# Patient Record
Sex: Female | Born: 1980 | Race: Black or African American | Hispanic: No | Marital: Married | Smoking: Former smoker
Health system: Southern US, Community
[De-identification: ages and names within clinical notes are randomized; demographics above are authoritative.]

## PROBLEM LIST (undated history)

## (undated) DIAGNOSIS — R51 Headache: Secondary | ICD-10-CM

## (undated) DIAGNOSIS — D649 Anemia, unspecified: Secondary | ICD-10-CM

## (undated) DIAGNOSIS — R519 Headache, unspecified: Secondary | ICD-10-CM

## (undated) DIAGNOSIS — N189 Chronic kidney disease, unspecified: Secondary | ICD-10-CM

## (undated) DIAGNOSIS — B999 Unspecified infectious disease: Secondary | ICD-10-CM

## (undated) DIAGNOSIS — R87629 Unspecified abnormal cytological findings in specimens from vagina: Secondary | ICD-10-CM

## (undated) HISTORY — PX: CHOLECYSTECTOMY: SHX55

## (undated) HISTORY — PX: CYST REMOVAL HAND: SHX6279

---
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
## 1998-01-13 ENCOUNTER — Encounter: Admission: RE | Admit: 1998-01-13 | Discharge: 1998-01-13 | Payer: Self-pay | Admitting: Family Medicine

## 1998-09-04 ENCOUNTER — Encounter: Payer: Self-pay | Admitting: Emergency Medicine

## 1998-09-04 ENCOUNTER — Emergency Department (HOSPITAL_COMMUNITY): Admission: EM | Admit: 1998-09-04 | Discharge: 1998-09-04 | Payer: Self-pay | Admitting: Emergency Medicine

## 1998-10-28 ENCOUNTER — Emergency Department (HOSPITAL_COMMUNITY): Admission: EM | Admit: 1998-10-28 | Discharge: 1998-10-28 | Payer: Self-pay | Admitting: Emergency Medicine

## 1999-03-04 ENCOUNTER — Ambulatory Visit (HOSPITAL_COMMUNITY): Admission: RE | Admit: 1999-03-04 | Discharge: 1999-03-04 | Payer: Self-pay | Admitting: Obstetrics

## 1999-05-22 ENCOUNTER — Inpatient Hospital Stay (HOSPITAL_COMMUNITY): Admission: AD | Admit: 1999-05-22 | Discharge: 1999-05-26 | Payer: Self-pay | Admitting: Obstetrics & Gynecology

## 1999-07-13 ENCOUNTER — Other Ambulatory Visit: Admission: RE | Admit: 1999-07-13 | Discharge: 1999-07-13 | Payer: Self-pay | Admitting: *Deleted

## 1999-08-20 ENCOUNTER — Inpatient Hospital Stay (HOSPITAL_COMMUNITY): Admission: AD | Admit: 1999-08-20 | Discharge: 1999-08-20 | Payer: Self-pay | Admitting: *Deleted

## 1999-08-20 ENCOUNTER — Inpatient Hospital Stay (HOSPITAL_COMMUNITY): Admission: AD | Admit: 1999-08-20 | Discharge: 1999-08-27 | Payer: Self-pay

## 1999-09-08 ENCOUNTER — Encounter: Admission: RE | Admit: 1999-09-08 | Discharge: 1999-12-07 | Payer: Self-pay | Admitting: Obstetrics & Gynecology

## 2000-12-19 ENCOUNTER — Inpatient Hospital Stay (HOSPITAL_COMMUNITY): Admission: AD | Admit: 2000-12-19 | Discharge: 2000-12-19 | Payer: Self-pay | Admitting: Obstetrics & Gynecology

## 2001-01-30 ENCOUNTER — Inpatient Hospital Stay (HOSPITAL_COMMUNITY): Admission: AD | Admit: 2001-01-30 | Discharge: 2001-01-30 | Payer: Self-pay | Admitting: Obstetrics

## 2001-02-04 ENCOUNTER — Inpatient Hospital Stay (HOSPITAL_COMMUNITY): Admission: AD | Admit: 2001-02-04 | Discharge: 2001-02-04 | Payer: Self-pay | Admitting: Obstetrics

## 2001-02-16 ENCOUNTER — Encounter: Payer: Self-pay | Admitting: Obstetrics

## 2001-02-16 ENCOUNTER — Ambulatory Visit (HOSPITAL_COMMUNITY): Admission: RE | Admit: 2001-02-16 | Discharge: 2001-02-16 | Payer: Self-pay | Admitting: Obstetrics

## 2001-03-03 ENCOUNTER — Encounter (HOSPITAL_COMMUNITY): Admission: RE | Admit: 2001-03-03 | Discharge: 2001-03-16 | Payer: Self-pay | Admitting: Obstetrics

## 2001-03-17 ENCOUNTER — Encounter (INDEPENDENT_AMBULATORY_CARE_PROVIDER_SITE_OTHER): Payer: Self-pay

## 2001-03-17 ENCOUNTER — Inpatient Hospital Stay (HOSPITAL_COMMUNITY): Admission: AD | Admit: 2001-03-17 | Discharge: 2001-03-20 | Payer: Self-pay | Admitting: Obstetrics

## 2006-06-21 DIAGNOSIS — N189 Chronic kidney disease, unspecified: Secondary | ICD-10-CM

## 2006-06-21 HISTORY — DX: Chronic kidney disease, unspecified: N18.9

## 2009-02-21 ENCOUNTER — Emergency Department (HOSPITAL_COMMUNITY): Admission: EM | Admit: 2009-02-21 | Discharge: 2009-02-21 | Payer: Self-pay | Admitting: Emergency Medicine

## 2009-06-13 ENCOUNTER — Emergency Department (HOSPITAL_COMMUNITY): Admission: EM | Admit: 2009-06-13 | Discharge: 2009-06-13 | Payer: Self-pay | Admitting: Emergency Medicine

## 2010-08-17 IMAGING — CR DG CHEST 2V
2 series · 2 of 2 positions shown · non-contrast
Comparison: None.

CLINICAL DATA: Bilateral foot swelling.  Chest tightness.  Smoker.

CHEST - 2 VIEW

[w chest pa]
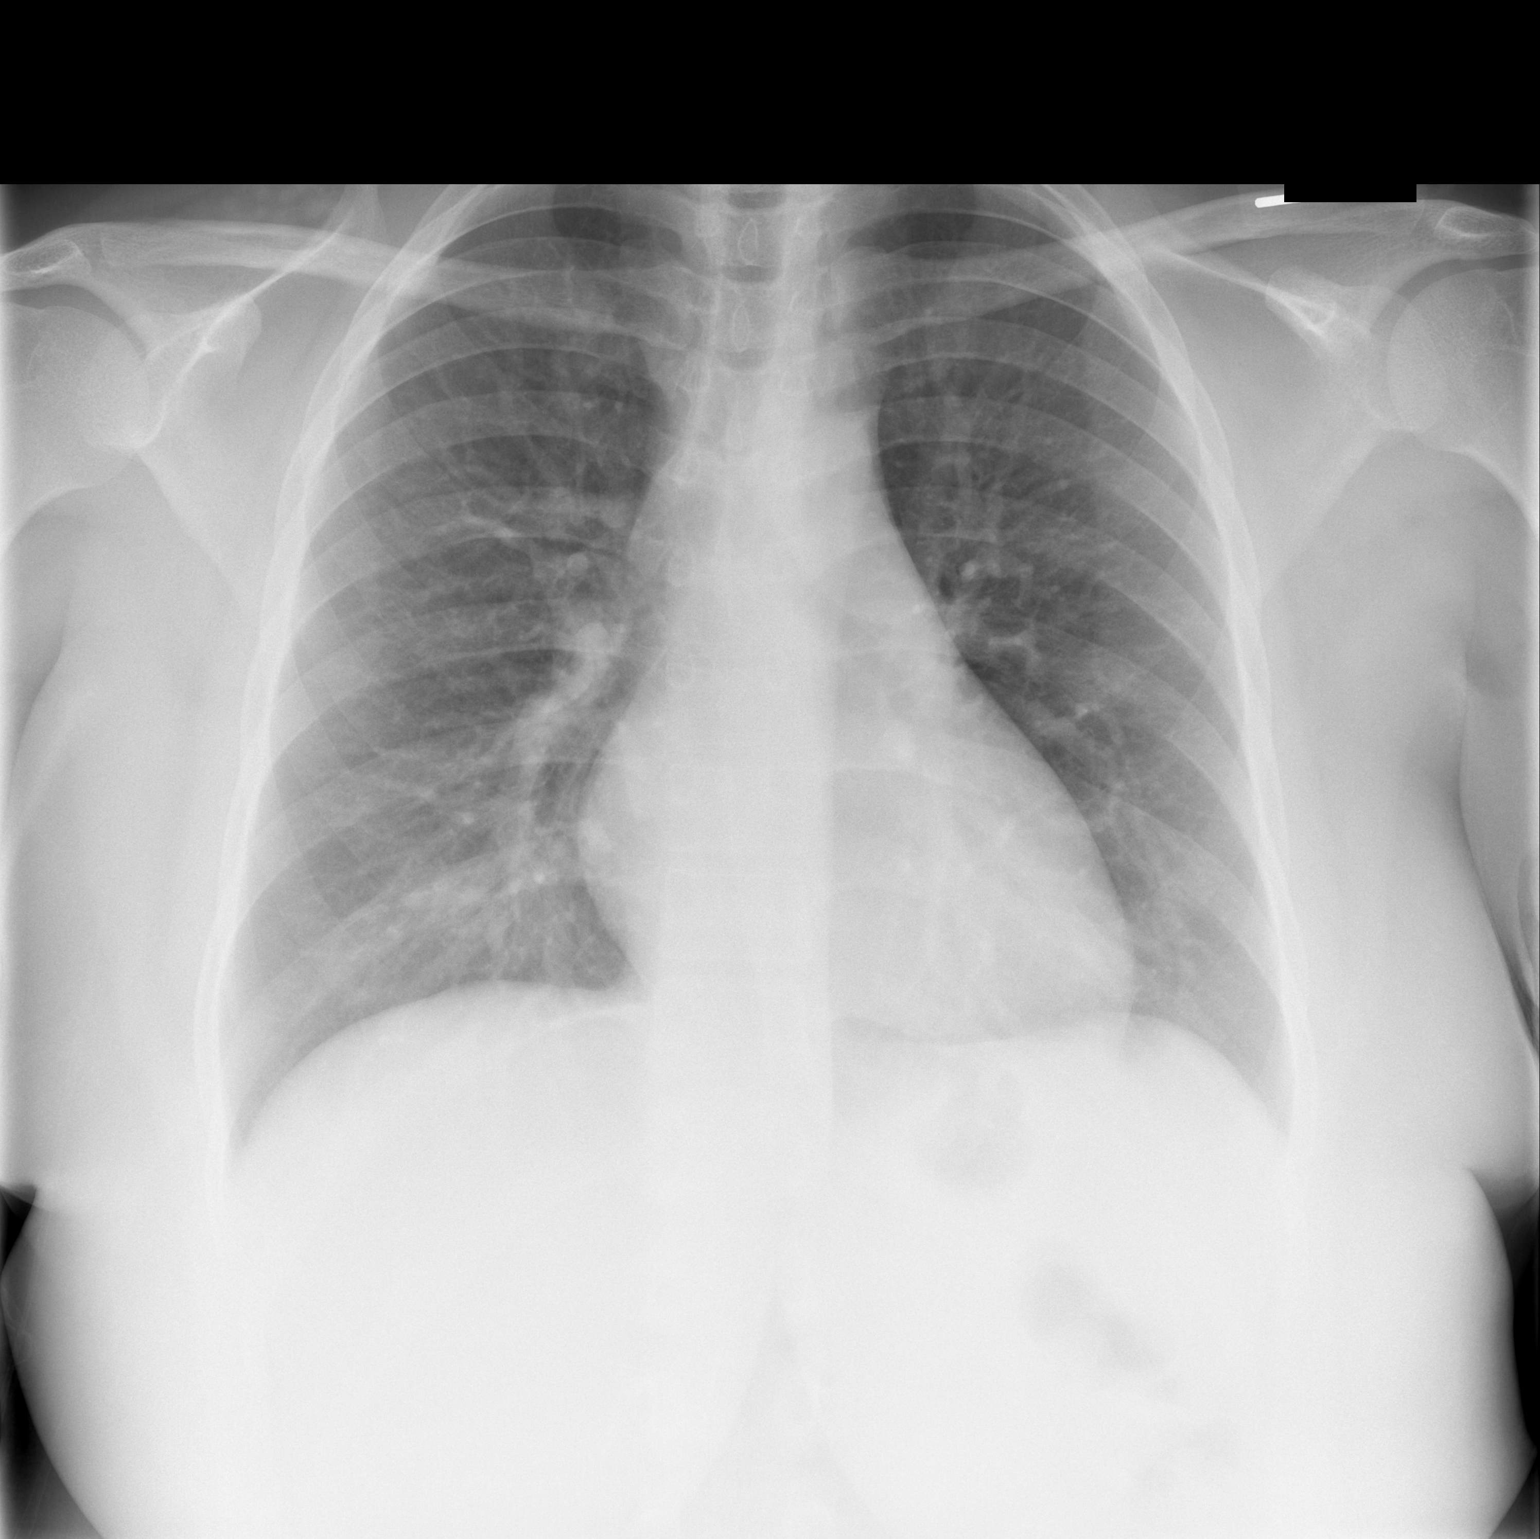

[w chest lat]
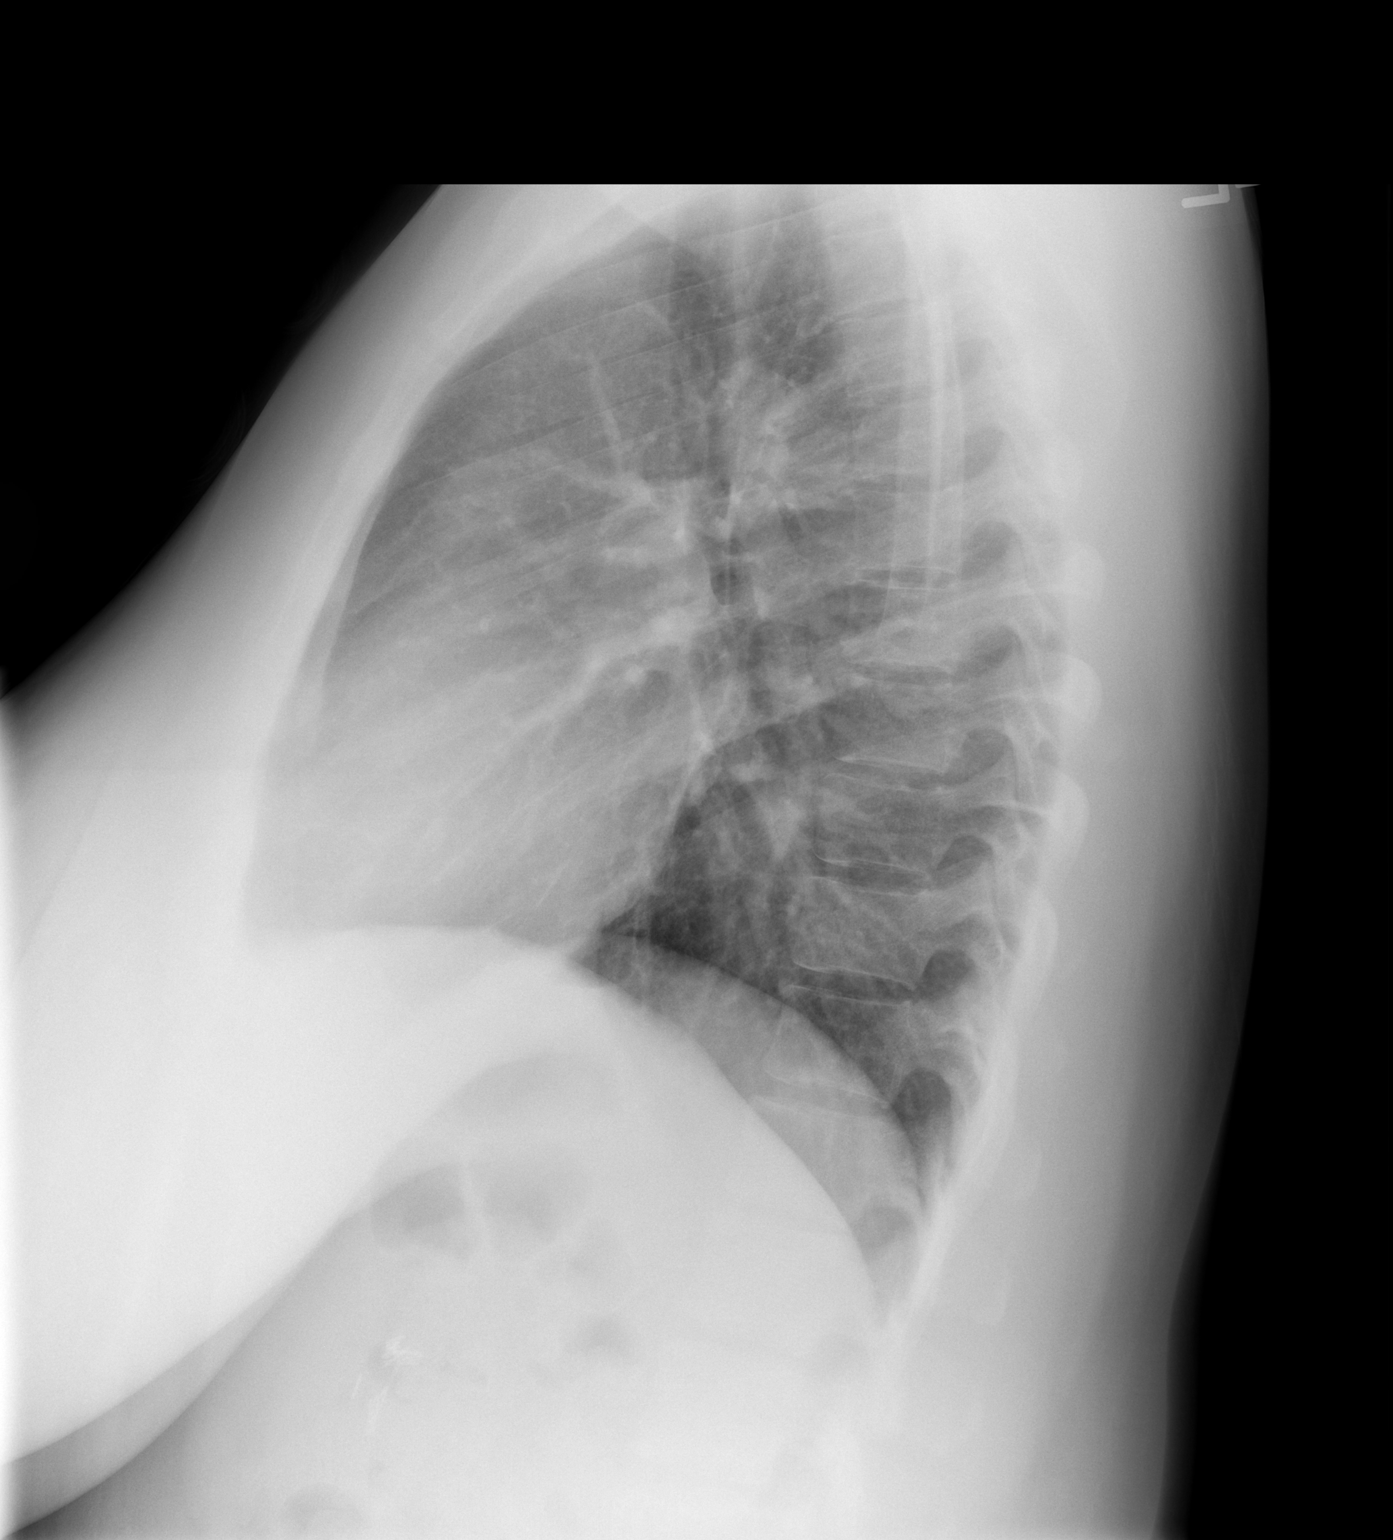

[2 of 2 positions shown; findings below may reference images not displayed]

FINDINGS: Normal sized heart.  Clear lungs.  Cholecystectomy clips.
Unremarkable bones.
IMPRESSION: No acute abnormality.

## 2010-09-21 LAB — URINE MICROSCOPIC-ADD ON

## 2010-09-21 LAB — URINALYSIS, ROUTINE W REFLEX MICROSCOPIC
Bilirubin Urine: NEGATIVE
Glucose, UA: NEGATIVE mg/dL
Ketones, ur: NEGATIVE mg/dL
Nitrite: NEGATIVE
Protein, ur: NEGATIVE mg/dL
Specific Gravity, Urine: 1.035 — ABNORMAL HIGH (ref 1.005–1.030)
Urobilinogen, UA: 1 mg/dL (ref 0.0–1.0)
pH: 6 (ref 5.0–8.0)

## 2010-09-25 LAB — COMPREHENSIVE METABOLIC PANEL
ALT: 12 U/L (ref 0–35)
AST: 20 U/L (ref 0–37)
Alkaline Phosphatase: 59 U/L (ref 39–117)
CO2: 26 mEq/L (ref 19–32)
Calcium: 8.7 mg/dL (ref 8.4–10.5)
GFR calc Af Amer: >60 mL/min (ref >60)
GFR calc non Af Amer: >60 mL/min (ref >60)
Potassium: 3.9 mEq/L (ref 3.5–5.1)
Sodium: 139 mEq/L (ref 135–145)

## 2010-09-25 LAB — URINALYSIS, ROUTINE W REFLEX MICROSCOPIC
Bilirubin Urine: NEGATIVE
Glucose, UA: NEGATIVE mg/dL
Hgb urine dipstick: NEGATIVE
Specific Gravity, Urine: 1.025 (ref 1.005–1.030)
Urobilinogen, UA: 0.2 mg/dL (ref 0.0–1.0)
pH: 8 (ref 5.0–8.0)

## 2010-11-06 NOTE — Discharge Summary (Signed)
[facility]  Patient:    Sarah Barr, Sarah Barr Visit Number: [id number] MRN: [id number]          Service Type: Attending:  Kathreen Cosier, M.D. Dictated by:   Kathreen Cosier, M.D. Adm. Date:  03/17/01 Disc. Date: 03/20/01                             Discharge Summary  HISTORY:                      The patient is a 30 year old gravida 2, para 1-0-0-1 with an EDC of March 19, 2001.  She had a previous C-section because of nonreassuring fetal heart rate tracing.  The patient was admitted on the morning of September 27 in labor and desired repeat C-section.  This was performed.  She had a female, Apgars of 8 and 9 weighing 9 lb 1 oz.  The placenta was sent to pathology.  Fluid was meconium stained.  On admission, her hemoglobin was 9.8, platelets 393.  On discharge, her hemoglobin was 8.2. She did well and was discharged on the third postoperative day, ambulatory and on a regular diet on Darvocet-N 100, one q.3-4h. p.r.n. pain and ferrous sulfate 325, one p.o. q.d.  DISCHARGE DIAGNOSIS:          Status post repeat low transverse cesarean section at term in labor. Dictated by:   Kathreen Cosier, M.D. Attending:  Kathreen Cosier, M.D. DD:  03/20/01 TD:  03/20/01 Job: [id number]

## 2010-11-06 NOTE — Op Note (Signed)
[facility]  Patient:    Sarah Barr, Sarah Barr                       MRN: [id number] Proc. Date: 08/21/99 Adm. Date:  [id number] Disc. Date: [id number] Attending:  Antionette Char                           Operative Report  PREOPERATIVE DIAGNOSIS:       Intrauterine pregnancy at term.  Latent labor with meconium stained fluid.  Nonreassuring fetal heart rate tracing with late decellerations.  POSTOPERATIVE DIAGNOSIS:      Intrauterine pregnancy at term.  Latent labor with meconium stained fluid.  Nonreassuring fetal heart rate tracing with late decellerations.  OPERATION:                    Primary low transverse cesarean section via Pfannenstiel incision.  SURGEON:                      Roseanna Rainbow, M.D.  ASSISTANT:                    Bing Neighbors. Clearance Coots, M.D.  ANESTHESIA:                   Epidural.  COMPLICATIONS:                None.  ESTIMATED BLOOD LOSS:         800 cc.  FLUIDS:                       1500 cc of Ringers lactate.  URINE OUTPUT:                 300 cc of clear urine at the end of the procedure.  INDICATIONS:                  The patient was a para 0 at term who underwent artificial rupture of membranes in latent labor for thick meconium stained fluid and then proceeded to have late decellerations.  Maximum dilatation was 3 to 4 m.  FINDINGS:                     Female infant in cephalic presentation.  The meconium was thin, as it had been diluted by the amnioinfusion.  Pediatrics were present at delivery.  Normal uterus, tubes, and ovaries.  DESCRIPTION OF PROCEDURE:     The patient was taken to the operating room where  epidural anesthetic was found to be adequate.  She was prepped and draped in the usual sterile fashion in the dorsal supine position with a leftward tilt.  A Pfannenstiel skin incision was then made with a scalpel and carried through to he underlying layer of fascia.  The fascia was incised  in the midline and the incision extended laterally with Mayo scissors.  The superior aspect of the fascial incision was then grasped with the Kocher clamps, elevated, and the underlying rectus muscles dissected off sharply.  Attention was then turned to the inferior aspect of this incision which was manipulated in a similar fashion.  The rectus muscles were separated in the midline.  The parietoperitoneum was entered sharply.  The peritoneal incision was extended bluntly.  The bladder blade was inserted and the vesicouterine peritoneum was identified, grasped with pickups, and entered sharply  with Metzenbaum scissors.  The incision was then extended laterally and the bladder flap created digitally.  The bladder blade was then reinserted and the lower uterine segment incised in a transverse fashion with the scalpel.  The uterine incision was then extended laterally with bandage scissors.  The bladder blade as removed and the infants head delivered atraumatically.  The nose and mouth were  suctioned with a DeLee suction trap and the cord was clamped and cut.  The infant was handed off to the awaiting pediatricians.  Cord gases were sent.  The placenta was then removed.  The uterus exteriorized and cleared of all clots and debris.  Following inspection of the uterine incision, there was a small right sided inferolateral extension of the uterine incision, approximately 2 cm.  This was repaired separately with 0 Monocryl.  The remainder of the uterine incision was  repaired with 0 Monocryl in a running locking fashion.  Excellent hemostasis was noted.  The gutters were cleared of all clots and after the uterus was returned to the abdomen, the fascia was reapproximated with 0 Vicryl in a running fashion. The skin was closed with staples.  The patient tolerated the procedure well. Sponge, needle, and instrument counts were correct x 2.  2 grams of Cefazolin were given at cord  clamp.  The patient was taken to the PACU in stable condition. DD:  08/24/99 TD:  08/25/99 Job: [id number]

## 2010-11-06 NOTE — Op Note (Signed)
[facility]  Patient:    Sarah Barr, Sarah Barr Visit Number: [id number] MRN: [id number]          Service Type: OBS Location: 910A [id number] [id number] Attending Physician:  Venita Sheffield Dictated by:   Kathreen Cosier, M.D. Proc. Date: 03/17/01 Admit Date:  03/17/2001                             Operative Report  PREOPERATIVE DIAGNOSIS:       Previous cesarean section at term, in labor, and                               desires repeat.  POSTOPERATIVE DIAGNOSIS:      Previous cesarean section at term, in labor, and                               desires repeat.  OPERATION:  SURGEON:                      Kathreen Cosier, M.D.  ANESTHESIA:                   Spinal.  DESCRIPTION OF PROCEDURE:     The patient was placed on the operating table in the supine position.  The abdomen was prepped and draped.  The bladder was emptied with a Foley catheter.  A transverse suprapubic incision was made through the old scar and carried down to the rectus fascia.  The fascia was ________ the length of the incision.  The recti muscles were retracted laterally.  The peritoneal incision and a transverse made in the visceroperitoneum above the bladder, and the bladder was pushed down off the lower segment.  A transverse lower uterine incision made  The fluid was meconium stained.  The baby was DeLeed prior to the shoulders.  She delivered a female with Apgars of 8 and 9.  Weight 9 pound and 1 ounce from the OP position.  The team was in attendance.  The placenta was removed manually.  It was anterior.  The placenta was sent to pathology.  The uterine cavity was cleaned with dry laps.  The uterine incision closed in one layer with continuous suture of #1 chromic.  Hemostasis was satisfactory.  The bladder flap was reattached with 2-0 chromic.  The uterus well-contracted.  The tubes and ovaries normal.  The abdomen was closed in layers.  The peritoneum  with continuous suture of 0 chromic, the fascia with continuous suture of 0 Dexon, and the skin closed with subcuticular stitch of 3-0 Monocryl.  Blood loss 400 cc.  The patient tolerated the procedure well and was taken to the recovery room in good condition. Dictated by:   Kathreen Cosier, M.D. Attending Physician:  Venita Sheffield DD:  03/17/01 TD:  03/17/01 Job: [id number]

## 2010-11-06 NOTE — Discharge Summary (Signed)
[facility]  Patient:    Sarah Barr, Sarah Barr                       MRN: [id number] Adm. Date:  [id number] Disc. Date: [id number] Attending:  Antionette Char Dictator:   Nolon Nations, M.D.                           Discharge Summary  ADMISSION DIAGNOSES:          1. Nonreassuring fetal heart rate strip.                               2. Term pregnancy.  DISCHARGE DIAGNOSES:          1. Status post low transverse cesarean section.                               2. Endometritis.  PROCEDURE:                    Primary low transverse cesarean section on March , 2001.  HISTORY OF PRESENT ILLNESS:   Ms. Sarah Barr is a 29 year old, gravida 1, para 0, ho presented at 40-5/7 weeks with increasing contractions.  She was noting no bleeding or rupture of membranes and had noted fetal movement.  She had been seen earlier in the MAU for contractions.  Ms. Sarah Barr had a history only significant for mild dysplasia on a previous Pap.  She was noted to have a nonreassuring fetal heart  rate strip with variable decellerations and was admitted to L&D for fetal monitoring and consideration of Pitocin.  HOSPITAL COURSE:              The patient was admitted and noted to have continued late decellerations on the fetal heart rate monitor overnight.  The patients membranes were ruptured and amnioinfusion was started.  The patient was noted to have meconium stained fluid and repetitive variables and fairly large fetus. the patient failed to progress and was taken to the OR for a primary LTCS.  The infant was taken to the NICU for meconium.  Her postpartum course was complicated by endometritis.  She was given ampicillin, gentamicin, and clindamycin and was discharged after being afebrile for 48 hours.  CONDITION ON DISCHARGE:       Good.  Discharged to home.  DISCHARGE MEDICATIONS:        1. Augmentin 875 mg b.i.d. x 5 days.                               2. Motrin  600 mg q.6h. p.r.n. pain.                               3. Vicodin one tablet q.6h. p.r.n. severe pain.                               4. Iron 375 mg b.i.d.                               5. Colace over-the-counter while on iron. DD:  11/04/99  TD:  11/05/99 Job: [id number]

## 2011-01-26 ENCOUNTER — Emergency Department (HOSPITAL_COMMUNITY)
Admission: EM | Admit: 2011-01-26 | Discharge: 2011-01-26 | Disposition: A | Discharge status: Home / Self Care | Payer: Self-pay | Attending: Emergency Medicine | Admitting: Emergency Medicine

## 2011-01-26 DIAGNOSIS — K029 Dental caries, unspecified: Secondary | ICD-10-CM | POA: Insufficient documentation

## 2011-01-26 DIAGNOSIS — K089 Disorder of teeth and supporting structures, unspecified: Secondary | ICD-10-CM | POA: Insufficient documentation

## 2011-01-26 DIAGNOSIS — E669 Obesity, unspecified: Secondary | ICD-10-CM | POA: Insufficient documentation

## 2011-03-26 ENCOUNTER — Emergency Department (HOSPITAL_COMMUNITY): Payer: Medicaid Other

## 2011-03-26 ENCOUNTER — Emergency Department (HOSPITAL_COMMUNITY)
Admission: EM | Admit: 2011-03-26 | Discharge: 2011-03-26 | Disposition: A | Discharge status: Home / Self Care | Payer: Medicaid Other | Attending: Emergency Medicine | Admitting: Emergency Medicine

## 2011-03-26 DIAGNOSIS — M5430 Sciatica, unspecified side: Secondary | ICD-10-CM | POA: Insufficient documentation

## 2011-03-26 DIAGNOSIS — M545 Low back pain, unspecified: Secondary | ICD-10-CM | POA: Insufficient documentation

## 2011-12-29 ENCOUNTER — Emergency Department (HOSPITAL_COMMUNITY)
Admission: EM | Admit: 2011-12-29 | Discharge: 2011-12-29 | Disposition: A | Discharge status: Home / Self Care | Payer: Medicaid Other | Attending: Emergency Medicine | Admitting: Emergency Medicine

## 2011-12-29 ENCOUNTER — Encounter (HOSPITAL_COMMUNITY): Payer: Self-pay | Admitting: *Deleted

## 2011-12-29 DIAGNOSIS — J069 Acute upper respiratory infection, unspecified: Secondary | ICD-10-CM | POA: Insufficient documentation

## 2011-12-29 DIAGNOSIS — F17200 Nicotine dependence, unspecified, uncomplicated: Secondary | ICD-10-CM | POA: Insufficient documentation

## 2011-12-29 MED ORDER — AZITHROMYCIN 250 MG PO TABS: 250.0000 mg | ORAL_TABLET | Freq: Every day | ORAL | Status: AC

## 2011-12-29 MED ORDER — HYDROCOD POLST-CHLORPHEN POLST 10-8 MG/5ML PO LQCR: 5.0000 mL | Freq: Two times a day (BID) | ORAL | Status: DC | PRN

## 2011-12-29 NOTE — ED Notes (Addendum)
Last night developed cold upper resp symptoms, cough that results in vomiting, nasal congestion, headache worse with cough

## 2011-12-29 NOTE — ED Notes (Signed)
Pt states she has had dry cough since last night. Pt states she is having congestion and no sob. Pt states she started to have a productive cough this morning sputum noted to be yellow and thick

## 2011-12-29 NOTE — ED Provider Notes (Signed)
History     CSN: [id number]  Arrival date & time 12/29/11  1517   First MD Initiated Contact with Patient 12/29/11 1707      Chief Complaint  Patient presents with  . URI    (Consider location/radiation/quality/duration/timing/severity/associated sxs/prior treatment) HPI  Generally healthy 31 year old female presents with cold symptoms. Patient's report since last night she has had headache, runny nose, sneezing, cough productive with yellow sputum, chest congestions, body aches, and feeling tired. She tried drinking hot tea and cough syrup without improvement. She was unable to sleep due to the symptoms. She went to work today but states her sxs worsen.  She denies fever, hemoptysis, nausea, vomiting, abdominal pain, back pain, urinary symptoms. Denies any recent sick contact.  History reviewed. No pertinent past medical history.  History reviewed. No pertinent past surgical history.  No family history on file.  History  Substance Use Topics  . Smoking status: Current Some Day Smoker  . Smokeless tobacco: Not on file  . Alcohol Use: No    OB History    Grav Para Term Preterm Abortions TAB SAB Ect Mult Living                  Review of Systems  All other systems reviewed and are negative.    Allergies  Review of patient's allergies indicates no known allergies.  Home Medications   Current Outpatient Rx  Name Route Sig Dispense Refill  . IBUPROFEN 200 MG PO TABS Oral Take 200 mg by mouth every 6 (six) hours as needed. Pain      BP 120/85  Pulse 82  Temp 98.7 F (37.1 C) (Oral)  Resp 18  SpO2 100%  LMP 12/21/2011  Physical Exam  Nursing note and vitals reviewed. Constitutional: She appears well-developed and well-nourished. No distress.       Awake, alert, nontoxic appearance  HENT:  Head: Atraumatic.  Right Ear: Hearing, tympanic membrane and external ear normal.  Left Ear: Hearing, tympanic membrane, external ear and ear canal normal.  Nose:  Mucosal edema and rhinorrhea present. No nasal deformity, septal deviation or nasal septal hematoma.  Mouth/Throat: Uvula is midline, oropharynx is clear and moist and mucous membranes are normal.  Eyes: Conjunctivae are normal. Right eye exhibits no discharge. Left eye exhibits no discharge.  Neck: Neck supple.  Cardiovascular: Normal rate and regular rhythm.   Pulmonary/Chest: Effort normal. No respiratory distress. She exhibits no tenderness.       Mild scattered rhonchi noted to the upper lung field  Abdominal: Soft. There is no tenderness. There is no rebound.  Musculoskeletal: She exhibits no tenderness.       ROM appears intact, no obvious focal weakness  Neurological:       Mental status and motor strength appears intact  Skin: No rash noted.  Psychiatric: She has a normal mood and affect.    ED Course  Procedures (including critical care time)  Labs Reviewed - No data to display No results found.   No diagnosis found.  1. URI  MDM  Patient with URI symptoms. However she is a smoker, therefore prescribe Z-Pak to prevent secondary infection.  Pt voice understanding and agrees with plan.  Recommend hand washing and wear a face mask at work to decrease risk of transmitting.          [name], PA-C 12/29/11 1717

## 2011-12-29 NOTE — ED Provider Notes (Signed)
Medical screening examination/treatment/procedure(s) were performed by non-physician practitioner and as supervising physician I was immediately available for consultation/collaboration.  Devoria Albe, MD, FACEP   Ward Givens, MD 12/29/11 [phone]

## 2012-05-25 ENCOUNTER — Emergency Department (HOSPITAL_COMMUNITY)
Admission: EM | Admit: 2012-05-25 | Discharge: 2012-05-26 | Disposition: A | Discharge status: Home / Self Care | Payer: Medicaid Other | Attending: Emergency Medicine | Admitting: Emergency Medicine

## 2012-05-25 ENCOUNTER — Encounter (HOSPITAL_COMMUNITY): Payer: Self-pay | Admitting: *Deleted

## 2012-05-25 DIAGNOSIS — F17200 Nicotine dependence, unspecified, uncomplicated: Secondary | ICD-10-CM | POA: Insufficient documentation

## 2012-05-25 DIAGNOSIS — R109 Unspecified abdominal pain: Secondary | ICD-10-CM | POA: Insufficient documentation

## 2012-05-25 LAB — URINALYSIS, ROUTINE W REFLEX MICROSCOPIC
Ketones, ur: NEGATIVE mg/dL
Nitrite: NEGATIVE
Specific Gravity, Urine: 1.037 — ABNORMAL HIGH (ref 1.005–1.030)
pH: 6 (ref 5.0–8.0)

## 2012-05-25 LAB — CBC WITH DIFFERENTIAL/PLATELET
Basophils Absolute: 0 10*3/uL (ref 0.0–0.1)
Eosinophils Absolute: 0.3 10*3/uL (ref 0.0–0.7)
HCT: 33 % — ABNORMAL LOW (ref 36.0–46.0)
Lymphocytes Relative: 31 % (ref 12–46)
Lymphs Abs: 2.6 10*3/uL (ref 0.7–4.0)
MCHC: 31.8 g/dL (ref 30.0–36.0)
MCV: 68.6 fL — ABNORMAL LOW (ref 78.0–100.0)
Neutro Abs: 4.8 10*3/uL (ref 1.7–7.7)
Platelets: 410 10*3/uL — ABNORMAL HIGH (ref 150–400)
RDW: 18.5 % — ABNORMAL HIGH (ref 11.5–15.5)

## 2012-05-25 LAB — COMPREHENSIVE METABOLIC PANEL
Albumin: 3.3 g/dL — ABNORMAL LOW (ref 3.5–5.2)
BUN: 11 mg/dL (ref 6–23)
Calcium: 9.1 mg/dL (ref 8.4–10.5)
Creatinine, Ser: 0.83 mg/dL (ref 0.50–1.10)
Total Protein: 7.5 g/dL (ref 6.0–8.3)

## 2012-05-25 LAB — URINE MICROSCOPIC-ADD ON

## 2012-05-25 LAB — LIPASE, BLOOD: Lipase: 29 U/L (ref 11–59)

## 2012-05-25 MED ORDER — KETOROLAC TROMETHAMINE 60 MG/2ML IM SOLN
60.0000 mg | Freq: Once | INTRAMUSCULAR | Status: AC
  Administered 2012-05-26: 60 mg via INTRAMUSCULAR
  Filled 2012-05-25: qty 2

## 2012-05-25 NOTE — ED Notes (Signed)
Pt c/o lower abd pain and lower back pain. States "my whole family has fibroids and I think that's what it is." pt denies vaginal bleeding at this time.

## 2012-05-25 NOTE — ED Provider Notes (Addendum)
History     CSN: [id number]  Arrival date & time 05/25/12  1714   First MD Initiated Contact with Patient 05/25/12 2348      Chief Complaint  Patient presents with  . Abdominal Pain    (Consider location/radiation/quality/duration/timing/severity/associated sxs/prior treatment) HPI Comments: Pt is a 31 y/o female with no chronic med problems on no meds.  Has had pain in the lower abdomen off and on X several months - worsened last night, Pain is in the R and L lower abd and pelvis.  Feels like Achy and crampy and sometimes the pain shoots into the vaginal area.  Went off cycle 2 weeks ago - has had increased Vaginal d/c.  Admits to having new constipation, last stool was while in waiting room but was hard.  Has mild nausea but no vomiting.  No f/c/cough/sob.  No swellign in legs.  Sx are intemrittent, nothing makes better.  Worse with using the bathroom.  No pain with eating.    Has had chole and Cesarean X 3 no GYN at this time.  Patient is a 31 y.o. female presenting with abdominal pain. The history is provided by the patient.  Abdominal Pain The primary symptoms of the illness include abdominal pain.    History reviewed. No pertinent past medical history.  Past Surgical History  Procedure Date  . Cholecystectomy   . Cesarean section     x's 3.   . Cyst removal hand     History reviewed. No pertinent family history.  History  Substance Use Topics  . Smoking status: Current Some Day Smoker    Types: Cigarettes  . Smokeless tobacco: Not on file  . Alcohol Use: No    OB History    Grav Para Term Preterm Abortions TAB SAB Ect Mult Living                  Review of Systems  Gastrointestinal: Positive for abdominal pain.  All other systems reviewed and are negative.    Allergies  Review of patient's allergies indicates no known allergies.  Home Medications   Current Outpatient Rx  Name  Route  Sig  Dispense  Refill  . IBUPROFEN 200 MG PO TABS   Oral   Take  200 mg by mouth every 6 (six) hours as needed. Pain         . NAPROXEN 500 MG PO TABS   Oral   Take 1 tablet (500 mg total) by mouth 2 (two) times daily with a meal.   30 tablet   0     BP 134/71  Pulse 95  Temp 98.8 F (37.1 C) (Oral)  Resp 20  Ht 5\' 8"  (1.727 m)  Wt 265 lb (120.203 kg)  BMI 40.29 kg/m2  SpO2 100%  LMP 05/07/2012  Physical Exam  Nursing note and vitals reviewed. Constitutional: She appears well-developed and well-nourished. No distress.  HENT:  Head: Normocephalic and atraumatic.  Mouth/Throat: Oropharynx is clear and moist. No oropharyngeal exudate.  Eyes: Conjunctivae normal and EOM are normal. Pupils are equal, round, and reactive to light. Right eye exhibits no discharge. Left eye exhibits no discharge. No scleral icterus.  Neck: Normal range of motion. Neck supple. No JVD present. No thyromegaly present.  Cardiovascular: Normal rate, regular rhythm, normal heart sounds and intact distal pulses.  Exam reveals no gallop and no friction rub.   No murmur heard. Pulmonary/Chest: Effort normal and breath sounds normal. No respiratory distress. She has no wheezes.  She has no rales.  Abdominal: Soft. Bowel sounds are normal. She exhibits no distension and no mass. There is tenderness ( SP ttp without guarding or peritoneal signs, no other ttp, obese.  no cCVA ttp).  Genitourinary:       Normal appearing external genitalia, no cervical motion tenderness or adnexal tenderness or masses, no significant discharge, no bleeding   Chaperone present for entire exam  Musculoskeletal: Normal range of motion. She exhibits no edema and no tenderness.  Lymphadenopathy:    She has no cervical adenopathy.  Neurological: She is alert. Coordination normal.  Skin: Skin is warm and dry. No rash noted. No erythema.  Psychiatric: She has a normal mood and affect. Her behavior is normal.    ED Course  Procedures (including critical care time)  Labs Reviewed  CBC WITH  DIFFERENTIAL - Abnormal; Notable for the following:    Hemoglobin 10.5 (*)     HCT 33.0 (*)     MCV 68.6 (*)     MCH 21.8 (*)     RDW 18.5 (*)     Platelets 410 (*)     All other components within normal limits  COMPREHENSIVE METABOLIC PANEL - Abnormal; Notable for the following:    Albumin 3.3 (*)     Total Bilirubin 0.2 (*)     All other components within normal limits  URINALYSIS, ROUTINE W REFLEX MICROSCOPIC - Abnormal; Notable for the following:    APPearance CLOUDY (*)     Specific Gravity, Urine 1.037 (*)     Leukocytes, UA SMALL (*)     All other components within normal limits  URINE MICROSCOPIC-ADD ON - Abnormal; Notable for the following:    Squamous Epithelial / LPF MANY (*)     Bacteria, UA MANY (*)     All other components within normal limits  WET PREP, GENITAL - Abnormal; Notable for the following:    WBC, Wet Prep HPF POC RARE (*)     All other components within normal limits  LIPASE, BLOOD  POCT PREGNANCY, URINE  URINE CULTURE  GC/CHLAMYDIA PROBE AMP   No results found.   1. Abdominal pain       MDM  The patient has lower pelvic pain, she does not have a urinary infection, her lab work shows mild anemia which I have discussed with the patient, she will followup with family doctor or GYN this week. She may need an ultrasound for outpatient further evaluation of either ovarian cyst or uterine fibroids. She has not had any heart signs of STDs, she is not have any findings on her wet prep of significant infections, she can followup as an outpatient, Naprosyn prescribed, doubt ovarian torsion or appendicitis given the location of pain and timing.        Vida Roller, MD 05/26/12 7829  Vida Roller, MD 05/26/12 [phone]

## 2012-05-26 LAB — WET PREP, GENITAL: Trich, Wet Prep: NONE SEEN

## 2012-05-26 LAB — URINE CULTURE

## 2012-05-26 LAB — GC/CHLAMYDIA PROBE AMP: CT Probe RNA: NEGATIVE

## 2012-05-26 MED ORDER — NAPROXEN 500 MG PO TABS: 500.0000 mg | ORAL_TABLET | Freq: Two times a day (BID) | ORAL | Status: DC

## 2012-05-26 NOTE — ED Notes (Signed)
Pelvic cart set up at pt bedside. 

## 2012-05-31 ENCOUNTER — Encounter: Payer: Medicaid Other | Admitting: Obstetrics & Gynecology

## 2012-08-04 ENCOUNTER — Encounter (HOSPITAL_COMMUNITY): Payer: Self-pay | Admitting: Emergency Medicine

## 2012-08-04 ENCOUNTER — Emergency Department (HOSPITAL_COMMUNITY)
Admission: EM | Admit: 2012-08-04 | Discharge: 2012-08-04 | Disposition: A | Discharge status: Home / Self Care | Payer: No Typology Code available for payment source | Attending: Emergency Medicine | Admitting: Emergency Medicine

## 2012-08-04 DIAGNOSIS — IMO0001 Reserved for inherently not codable concepts without codable children: Secondary | ICD-10-CM | POA: Insufficient documentation

## 2012-08-04 DIAGNOSIS — Y92410 Unspecified street and highway as the place of occurrence of the external cause: Secondary | ICD-10-CM | POA: Insufficient documentation

## 2012-08-04 DIAGNOSIS — Z9089 Acquired absence of other organs: Secondary | ICD-10-CM | POA: Insufficient documentation

## 2012-08-04 DIAGNOSIS — M549 Dorsalgia, unspecified: Secondary | ICD-10-CM

## 2012-08-04 DIAGNOSIS — Y9389 Activity, other specified: Secondary | ICD-10-CM | POA: Insufficient documentation

## 2012-08-04 DIAGNOSIS — F17200 Nicotine dependence, unspecified, uncomplicated: Secondary | ICD-10-CM | POA: Insufficient documentation

## 2012-08-04 DIAGNOSIS — IMO0002 Reserved for concepts with insufficient information to code with codable children: Secondary | ICD-10-CM | POA: Insufficient documentation

## 2012-08-04 MED ORDER — OXYCODONE-ACETAMINOPHEN 5-325 MG PO TABS: ORAL_TABLET | ORAL | Status: DC

## 2012-08-04 MED ORDER — CYCLOBENZAPRINE HCL 10 MG PO TABS: 10.0000 mg | ORAL_TABLET | Freq: Two times a day (BID) | ORAL | Status: DC | PRN
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
## 2012-08-05 NOTE — ED Provider Notes (Signed)
Medical screening examination/treatment/procedure(s) were performed by non-physician practitioner and as supervising physician I was immediately available for consultation/collaboration.   Dione Booze, MD 08/05/12 [phone]

## 2012-09-15 ENCOUNTER — Encounter (HOSPITAL_COMMUNITY): Payer: Self-pay

## 2012-09-15 ENCOUNTER — Emergency Department (HOSPITAL_COMMUNITY)
Admission: EM | Admit: 2012-09-15 | Discharge: 2012-09-15 | Disposition: A | Discharge status: Home / Self Care | Payer: Medicaid Other | Attending: Emergency Medicine | Admitting: Emergency Medicine

## 2012-09-15 DIAGNOSIS — F17200 Nicotine dependence, unspecified, uncomplicated: Secondary | ICD-10-CM | POA: Insufficient documentation

## 2012-09-15 DIAGNOSIS — R059 Cough, unspecified: Secondary | ICD-10-CM | POA: Insufficient documentation

## 2012-09-15 DIAGNOSIS — J3489 Other specified disorders of nose and nasal sinuses: Secondary | ICD-10-CM | POA: Insufficient documentation

## 2012-09-15 DIAGNOSIS — J040 Acute laryngitis: Secondary | ICD-10-CM

## 2012-09-15 DIAGNOSIS — R05 Cough: Secondary | ICD-10-CM | POA: Insufficient documentation

## 2012-09-15 DIAGNOSIS — B9789 Other viral agents as the cause of diseases classified elsewhere: Secondary | ICD-10-CM | POA: Insufficient documentation

## 2012-09-15 DIAGNOSIS — R491 Aphonia: Secondary | ICD-10-CM | POA: Insufficient documentation

## 2012-09-15 DIAGNOSIS — L539 Erythematous condition, unspecified: Secondary | ICD-10-CM | POA: Insufficient documentation

## 2012-09-15 DIAGNOSIS — R6883 Chills (without fever): Secondary | ICD-10-CM | POA: Insufficient documentation

## 2012-09-15 DIAGNOSIS — R51 Headache: Secondary | ICD-10-CM | POA: Insufficient documentation

## 2012-09-15 DIAGNOSIS — B349 Viral infection, unspecified: Secondary | ICD-10-CM

## 2012-09-15 MED ORDER — HYDROCODONE-ACETAMINOPHEN 7.5-325 MG/15ML PO SOLN: 15.0000 mL | Freq: Four times a day (QID) | ORAL | Status: DC | PRN

## 2012-09-15 MED ORDER — PREDNISOLONE SODIUM PHOSPHATE 15 MG/5ML PO SOLN: 15.0000 mg | Freq: Every day | ORAL | Status: AC

## 2012-09-15 NOTE — ED Provider Notes (Signed)
Medical screening examination/treatment/procedure(s) were performed by non-physician practitioner and as supervising physician I was immediately available for consultation/collaboration.   Charles B. Sheldon, MD 09/15/12 1520 

## 2012-09-15 NOTE — ED Provider Notes (Signed)
History     CSN: [id number]  Arrival date & time 09/15/12  1258   First MD Initiated Contact with Patient 09/15/12 1331      No chief complaint on file.   (Consider location/radiation/quality/duration/timing/severity/associated sxs/prior treatment) HPI  32 year old female presents complaining of sore throat. Patient reports for the past 2 days she has had headache, nasal congestion, sore throat, cough productive of yellow phlegm, and chills. She also lost her voice this morning.  Her daughter is getting over a cold.  Pt has tried nothing for her sxs.  Nothing makes it better or worse.  Denies fever, vision changes, ear pain, hemoptysis, cp, sob, or rash.  Is an occasional smoker.    History reviewed. No pertinent past medical history.  Past Surgical History  Procedure Laterality Date  . Cholecystectomy    . Cesarean section      x's 3.   . Cyst removal hand      History reviewed. No pertinent family history.  History  Substance Use Topics  . Smoking status: Current Some Day Smoker    Types: Cigarettes  . Smokeless tobacco: Not on file  . Alcohol Use: No    OB History   Grav Para Term Preterm Abortions TAB SAB Ect Mult Living                  Review of Systems  Constitutional:       10 Systems reviewed and all are negative for acute change except as noted in the HPI.     Allergies  Review of patient's allergies indicates no known allergies.  Home Medications   Current Outpatient Rx  Name  Route  Sig  Dispense  Refill  . Ibuprofen (IBU PO)   Oral   Take 1 tablet by mouth once.           BP 163/80  Pulse 88  Temp(Src) 99 F (37.2 C) (Oral)  Resp 20  SpO2 98%  LMP 09/15/2012  Physical Exam  Nursing note and vitals reviewed. Constitutional: She appears well-developed and well-nourished. No distress.  Awake, alert, nontoxic appearance  HENT:  Head: Atraumatic.  Right Ear: External ear normal.  Left Ear: External ear normal.  Nose: Nose normal.   Mouth/Throat: Oropharynx is clear and moist.  Throat: mild postoropharyngeal erythema without abscess.  Uvula midline, no tonsillar enlargement or exudates.    Loss of voice  Eyes: Conjunctivae are normal. Right eye exhibits no discharge. Left eye exhibits no discharge.  Neck: Neck supple.  Cardiovascular: Normal rate and regular rhythm.   Pulmonary/Chest: Effort normal. No respiratory distress. She exhibits no tenderness.  Abdominal: Soft. There is no tenderness. There is no rebound.  Musculoskeletal: She exhibits no tenderness.  ROM appears intact, no obvious focal weakness  Neurological:  Mental status and motor strength appears intact  Skin: No rash noted.  Psychiatric: She has a normal mood and affect.    ED Course  Procedures (including critical care time)  2:13 PM Pt with URI sxs.  No finding worrisome for deep tissue infection.  Is afebrile, lung's ctab, doubt pna.  Has evidence of laryngitis with lost of voice.  Will treat symptoms.  Labs Reviewed - No data to display No results found.   1. Viral syndrome   2. Laryngitis       MDM  BP 163/80  Pulse 88  Temp(Src) 99 F (37.2 C) (Oral)  Resp 20  SpO2 98%  LMP 09/15/2012  [name], PA-C 09/15/12 1514

## 2012-09-15 NOTE — ED Notes (Signed)
Pt presents with 2 day h/o productive cough and sore throat.  Pt reports coughing up yellow phlegm, minimal nasal congestion.  Daughter at home with same.  +chills

## 2014-02-13 ENCOUNTER — Other Ambulatory Visit (HOSPITAL_COMMUNITY): Payer: Self-pay | Admitting: Internal Medicine

## 2014-02-13 DIAGNOSIS — IMO0002 Reserved for concepts with insufficient information to code with codable children: Secondary | ICD-10-CM

## 2014-02-22 ENCOUNTER — Ambulatory Visit (HOSPITAL_COMMUNITY): Admission: RE | Admit: 2014-02-22 | Payer: Medicaid Other | Source: Ambulatory Visit

## 2014-05-07 ENCOUNTER — Inpatient Hospital Stay (HOSPITAL_COMMUNITY): Payer: Medicaid Other

## 2014-05-07 ENCOUNTER — Encounter (HOSPITAL_COMMUNITY): Payer: Self-pay | Admitting: *Deleted

## 2014-05-07 ENCOUNTER — Inpatient Hospital Stay (HOSPITAL_COMMUNITY)
Admission: AD | Admit: 2014-05-07 | Discharge: 2014-05-07 | Disposition: A | Discharge status: Home / Self Care | Payer: Medicaid Other | Source: Ambulatory Visit | Attending: Obstetrics & Gynecology | Admitting: Obstetrics & Gynecology

## 2014-05-07 DIAGNOSIS — N939 Abnormal uterine and vaginal bleeding, unspecified: Secondary | ICD-10-CM | POA: Diagnosis present

## 2014-05-07 DIAGNOSIS — F17210 Nicotine dependence, cigarettes, uncomplicated: Secondary | ICD-10-CM | POA: Insufficient documentation

## 2014-05-07 HISTORY — DX: Headache: R51

## 2014-05-07 HISTORY — DX: Unspecified abnormal cytological findings in specimens from vagina: R87.629

## 2014-05-07 HISTORY — DX: Anemia, unspecified: D64.9

## 2014-05-07 HISTORY — DX: Headache, unspecified: R51.9

## 2014-05-07 HISTORY — DX: Unspecified infectious disease: B99.9

## 2014-05-07 HISTORY — DX: Chronic kidney disease, unspecified: N18.9

## 2014-05-07 LAB — CBC WITH DIFFERENTIAL/PLATELET
BASOS ABS: 0 10*3/uL (ref 0.0–0.1)
BASOS PCT: 0 % (ref 0–1)
Eosinophils Absolute: 0.5 10*3/uL (ref 0.0–0.7)
Eosinophils Relative: 5 % (ref 0–5)
HEMATOCRIT: 29.5 % — AB (ref 36.0–46.0)
HEMOGLOBIN: 9.2 g/dL — AB (ref 12.0–15.0)
LYMPHS PCT: 35 % (ref 12–46)
Lymphs Abs: 3.1 10*3/uL (ref 0.7–4.0)
MCH: 20.5 pg — ABNORMAL LOW (ref 26.0–34.0)
MCHC: 31.2 g/dL (ref 30.0–36.0)
MCV: 65.8 fL — ABNORMAL LOW (ref 78.0–100.0)
Monocytes Absolute: 0.5 10*3/uL (ref 0.1–1.0)
Monocytes Relative: 6 % (ref 3–12)
NEUTROS ABS: 4.6 10*3/uL (ref 1.7–7.7)
NEUTROS PCT: 53 % (ref 43–77)
Platelets: 448 10*3/uL — ABNORMAL HIGH (ref 150–400)
RBC: 4.48 MIL/uL (ref 3.87–5.11)
RDW: 20.6 % — AB (ref 11.5–15.5)
WBC: 8.6 10*3/uL (ref 4.0–10.5)

## 2014-05-07 LAB — URINALYSIS, ROUTINE W REFLEX MICROSCOPIC
Bilirubin Urine: NEGATIVE
Glucose, UA: NEGATIVE mg/dL
Ketones, ur: NEGATIVE mg/dL
LEUKOCYTES UA: NEGATIVE
NITRITE: NEGATIVE
PH: 6 (ref 5.0–8.0)
Protein, ur: NEGATIVE mg/dL
UROBILINOGEN UA: 0.2 mg/dL (ref 0.0–1.0)

## 2014-05-07 LAB — WET PREP, GENITAL
Clue Cells Wet Prep HPF POC: NONE SEEN
TRICH WET PREP: NONE SEEN
Yeast Wet Prep HPF POC: NONE SEEN

## 2014-05-07 LAB — URINE MICROSCOPIC-ADD ON

## 2014-05-07 LAB — POCT PREGNANCY, URINE: Preg Test, Ur: NEGATIVE

## 2014-05-07 MED ORDER — KETOROLAC TROMETHAMINE 60 MG/2ML IM SOLN
60.0000 mg | Freq: Once | INTRAMUSCULAR | Status: AC
  Administered 2014-05-07: 60 mg via INTRAMUSCULAR
  Filled 2014-05-07: qty 2

## 2014-05-07 MED ORDER — IBUPROFEN 800 MG PO TABS: 800.0000 mg | ORAL_TABLET | Freq: Three times a day (TID) | ORAL | Status: DC

## 2014-05-07 NOTE — MAU Note (Signed)
PT SAYS HER LMP  STARTED  ON  SAT.    HAD CYCLE IN OCT- SPOTTED X2 DAYS.,      SEPT- LASTED X5 DAYS.     DR  AT [facility]-   SHE HAS TOLD THEM ABOUT  IRREG CYCLES-   SAID [facility] W[facility]-  BUT THEY NEVER DID.         PT IS ON CYCLE NOW-   PAINFUL-  AND HEAVY  BLEEDING.      PAD   IN TRIAGE-     SMALL  AMT-  STREAKS   OF RED  AND DARK BROWN.       TOOK IBUPROFEN   200 MG   THIS AM.   AND HAS BEEN TAKING 2 TABS  SINCE  SAT.

## 2014-05-07 NOTE — Discharge Instructions (Signed)

## 2014-05-07 NOTE — MAU Provider Note (Signed)
History     CSN: [id number]  Arrival date and time: 05/07/14 4098   First Provider Initiated Contact with Patient 05/07/14 2219      No chief complaint on file.  HPI  Ms. Sarah Barr is a 33 y.o. [id number] who presents to MAU today with complaint of heavy bleeding and lower abdominal pain. The patient states a recent history of irregular periods. The patient describes regular periods monthly with irregular length and varying severity of bleeding and cramping. She states LMP of 05/04/14. She states that she used a full 24 pack of pads in one day but today has used only 6 pads. She also complains of lower abdominal pain rated at 8/10 now. She took ibuprofen last this morning with some relief. She does endorse feeling fatigued, but denies weakness and dizziness.   OB History    Gravida Para Term Preterm AB TAB SAB Ectopic Multiple Living   4    1 1    3       Past Medical History  Diagnosis Date  . Anemia   . Vaginal Pap smear, abnormal   . Headache   . Chronic kidney disease 2008    kidney stones  . Infection     UTI's in pregnancy    Past Surgical History  Procedure Laterality Date  . Cholecystectomy    . Cesarean section      x's 3.   . Cyst removal hand      No family history on file.  History  Substance Use Topics  . Smoking status: Current Some Day Smoker    Types: Cigarettes  . Smokeless tobacco: Never Used  . Alcohol Use: No    Allergies: No Known Allergies  No prescriptions prior to admission    Review of Systems  Constitutional: Positive for malaise/fatigue. Negative for fever.  Gastrointestinal: Positive for abdominal pain.  Genitourinary: Negative for dysuria, urgency and frequency.       + vaginal bleeding  Neurological: Negative for dizziness, loss of consciousness and weakness.   Physical Exam   Blood pressure 147/84, pulse 77, temperature 98.1 F (36.7 C), temperature source Oral, resp. rate 20, height 5\' 7"  (1.702 m), weight 294 lb 6  oz (133.528 kg), last menstrual period 05/04/2014.  Physical Exam  Constitutional: She is oriented to person, place, and time. She appears well-developed and well-nourished. No distress.  HENT:  Head: Normocephalic.  Cardiovascular: Normal rate.   Respiratory: Effort normal.  GI: Soft. Bowel sounds are normal. She exhibits no distension and no mass. There is tenderness (mild tenderness to palpation of the suprapubic region at midline). There is no rebound and no guarding.  Genitourinary: Uterus is tender (mild). Uterus is not enlarged (exam limited my patient body habitus). Cervix exhibits no motion tenderness, no discharge and no friability. Right adnexum displays no mass and no tenderness. Left adnexum displays no mass and no tenderness. There is bleeding (small amount of blood noted in the vaginal vault) in the vagina. No vaginal discharge found.  Neurological: She is alert and oriented to person, place, and time.  Skin: Skin is warm and dry. No erythema.  Psychiatric: She has a normal mood and affect.   Results for orders placed or performed during the hospital encounter of 05/07/14 (from the past 24 hour(s))  Urinalysis, Routine w reflex microscopic     Status: Abnormal   Collection Time: 05/07/14  8:30 PM  Result Value Ref Range   Color, Urine YELLOW YELLOW   APPearance CLOUDY (  A) CLEAR   Specific Gravity, Urine >1.030 (H) 1.005 - 1.030   pH 6.0 5.0 - 8.0   Glucose, UA NEGATIVE NEGATIVE mg/dL   Hgb urine dipstick LARGE (A) NEGATIVE   Bilirubin Urine NEGATIVE NEGATIVE   Ketones, ur NEGATIVE NEGATIVE mg/dL   Protein, ur NEGATIVE NEGATIVE mg/dL   Urobilinogen, UA 0.2 0.0 - 1.0 mg/dL   Nitrite NEGATIVE NEGATIVE   Leukocytes, UA NEGATIVE NEGATIVE  Urine microscopic-add on     Status: Abnormal   Collection Time: 05/07/14  8:30 PM  Result Value Ref Range   Squamous Epithelial / LPF RARE RARE   WBC, UA 0-2 <3 WBC/hpf   RBC / HPF 7-10 <3 RBC/hpf   Bacteria, UA FEW (A) RARE  CBC with  Differential     Status: Abnormal   Collection Time: 05/07/14  8:40 PM  Result Value Ref Range   WBC 8.6 4.0 - 10.5 K/uL   RBC 4.48 3.87 - 5.11 MIL/uL   Hemoglobin 9.2 (L) 12.0 - 15.0 g/dL   HCT 60.429.5 (L) 54.036.0 - 98.146.0 %   MCV 65.8 (L) 78.0 - 100.0 fL   MCH 20.5 (L) 26.0 - 34.0 pg   MCHC 31.2 30.0 - 36.0 g/dL   RDW 19.120.6 (H) 47.811.5 - 29.515.5 %   Platelets 448 (H) 150 - 400 K/uL   Neutrophils Relative % 53 43 - 77 %   Neutro Abs 4.6 1.7 - 7.7 K/uL   Lymphocytes Relative 35 12 - 46 %   Lymphs Abs 3.1 0.7 - 4.0 K/uL   Monocytes Relative 6 3 - 12 %   Monocytes Absolute 0.5 0.1 - 1.0 K/uL   Eosinophils Relative 5 0 - 5 %   Eosinophils Absolute 0.5 0.0 - 0.7 K/uL   Basophils Relative 0 0 - 1 %   Basophils Absolute 0.0 0.0 - 0.1 K/uL  Pregnancy, urine POC     Status: None   Collection Time: 05/07/14 10:44 PM  Result Value Ref Range   Preg Test, Ur NEGATIVE NEGATIVE  Wet prep, genital     Status: Abnormal   Collection Time: 05/07/14 10:56 PM  Result Value Ref Range   Yeast Wet Prep HPF POC NONE SEEN NONE SEEN   Trich, Wet Prep NONE SEEN NONE SEEN   Clue Cells Wet Prep HPF POC NONE SEEN NONE SEEN   WBC, Wet Prep HPF POC RARE (A) NONE SEEN   [place] Transvaginal Non-ob  05/07/2014   CLINICAL DATA:  Abnormal uterine bleeding.  EXAM: TRANSABDOMINAL AND TRANSVAGINAL ULTRASOUND OF PELVIS  TECHNIQUE: Both transabdominal and transvaginal ultrasound examinations of the pelvis were performed. Transabdominal technique was performed for global imaging of the pelvis including uterus, ovaries, adnexal regions, and pelvic cul-de-sac. It was necessary to proceed with endovaginal exam following the transabdominal exam to visualize the uterus and endometrium.  COMPARISON:  None  FINDINGS: Uterus  Measurements: 11.3 x 5.4 x 6.8 cm, anteverted. Uterus is somewhat enlarged diffusely but no focal lesions are identified. Small nabothian cysts in the cervix.  Endometrium  Thickness: 5.5 mm.  No focal abnormality visualized.   Right ovary  Measurements: 2.5 x 1.9 x 1.5 cm. Limited visualization on transabdominal images. Not seen transvaginally. No abnormal adnexal masses suspected.  Left ovary  Left ovary is not visualized.  Other findings  No free fluid.  IMPRESSION: Uterus is mildly enlarged but no focal lesions are identified. Normal endometrial stripe thickness and appearance. Right ovary appears normal. Left ovary not visualized.   Electronically  Signed   By: Burman NievesWilliam  Stevens M.D.   On: 05/07/2014 23:36   [place] Pelvis Complete  05/07/2014   CLINICAL DATA:  Abnormal uterine bleeding.  EXAM: TRANSABDOMINAL AND TRANSVAGINAL ULTRASOUND OF PELVIS  TECHNIQUE: Both transabdominal and transvaginal ultrasound examinations of the pelvis were performed. Transabdominal technique was performed for global imaging of the pelvis including uterus, ovaries, adnexal regions, and pelvic cul-de-sac. It was necessary to proceed with endovaginal exam following the transabdominal exam to visualize the uterus and endometrium.  COMPARISON:  None  FINDINGS: Uterus  Measurements: 11.3 x 5.4 x 6.8 cm, anteverted. Uterus is somewhat enlarged diffusely but no focal lesions are identified. Small nabothian cysts in the cervix.  Endometrium  Thickness: 5.5 mm.  No focal abnormality visualized.  Right ovary  Measurements: 2.5 x 1.9 x 1.5 cm. Limited visualization on transabdominal images. Not seen transvaginally. No abnormal adnexal masses suspected.  Left ovary  Left ovary is not visualized.  Other findings  No free fluid.  IMPRESSION: Uterus is mildly enlarged but no focal lesions are identified. Normal endometrial stripe thickness and appearance. Right ovary appears normal. Left ovary not visualized.   Electronically Signed   By: Burman NievesWilliam  Stevens M.D.   On: 05/07/2014 23:36    MAU Course  Procedures None  MDM CBC, wet prep, GC/Chlamydia, HIV and US today 60 mg IM Toradol given in MAU  Assessment and Plan  A: AUB  P: Discharge home Rx for Ferrous  Sulfate and Ibuprofen 800 mg given/ sent to patient's pharmacy [facility] and further evaluation. They will call patient with an appointment date/time Bleeding precautions discussed Patient may return to MAU as needed or if her condition were to change or worsen   Marny LowensteinJulie N Hamdi Kley, PA-C  05/08/2014, 2:17 AM

## 2014-05-08 LAB — HIV ANTIBODY (ROUTINE TESTING W REFLEX): HIV: NONREACTIVE

## 2014-05-08 LAB — GC/CHLAMYDIA PROBE AMP
CT PROBE, AMP APTIMA: NEGATIVE
GC PROBE AMP APTIMA: NEGATIVE

## 2014-05-08 MED ORDER — FERROUS SULFATE 325 (65 FE) MG PO TABS
325.0000 mg | ORAL_TABLET | Freq: Every day | ORAL | Status: DC
Start: 2014-05-08 — End: 2016-03-05

## 2014-05-21 ENCOUNTER — Encounter: Payer: Self-pay | Admitting: Obstetrics & Gynecology

## 2014-07-01 ENCOUNTER — Encounter: Payer: Medicaid Other | Admitting: Obstetrics & Gynecology

## 2014-11-27 ENCOUNTER — Encounter (HOSPITAL_COMMUNITY): Payer: Self-pay

## 2014-11-27 ENCOUNTER — Emergency Department (HOSPITAL_COMMUNITY)
Admission: EM | Admit: 2014-11-27 | Discharge: 2014-11-27 | Disposition: A | Discharge status: Home / Self Care | Payer: Medicaid Other | Attending: Emergency Medicine | Admitting: Emergency Medicine

## 2014-11-27 DIAGNOSIS — M5440 Lumbago with sciatica, unspecified side: Secondary | ICD-10-CM | POA: Insufficient documentation

## 2014-11-27 DIAGNOSIS — D649 Anemia, unspecified: Secondary | ICD-10-CM | POA: Insufficient documentation

## 2014-11-27 DIAGNOSIS — Z720 Tobacco use: Secondary | ICD-10-CM | POA: Diagnosis not present

## 2014-11-27 DIAGNOSIS — N189 Chronic kidney disease, unspecified: Secondary | ICD-10-CM | POA: Insufficient documentation

## 2014-11-27 DIAGNOSIS — M545 Low back pain: Secondary | ICD-10-CM | POA: Diagnosis present

## 2014-11-27 DIAGNOSIS — Z791 Long term (current) use of non-steroidal anti-inflammatories (NSAID): Secondary | ICD-10-CM | POA: Insufficient documentation

## 2014-11-27 DIAGNOSIS — Z87440 Personal history of urinary (tract) infections: Secondary | ICD-10-CM | POA: Insufficient documentation

## 2014-11-27 DIAGNOSIS — Z79899 Other long term (current) drug therapy: Secondary | ICD-10-CM | POA: Insufficient documentation

## 2014-11-27 DIAGNOSIS — Z8669 Personal history of other diseases of the nervous system and sense organs: Secondary | ICD-10-CM | POA: Diagnosis not present

## 2014-11-27 DIAGNOSIS — M5441 Lumbago with sciatica, right side: Secondary | ICD-10-CM

## 2014-11-27 DIAGNOSIS — M5442 Lumbago with sciatica, left side: Secondary | ICD-10-CM

## 2014-11-27 MED ORDER — IBUPROFEN 800 MG PO TABS
800.0000 mg | ORAL_TABLET | Freq: Once | ORAL | Status: AC
Start: 2014-11-27 — End: 2014-11-27
  Administered 2014-11-27: 800 mg via ORAL
  Filled 2014-11-27: qty 1

## 2014-11-27 MED ORDER — PREDNISONE 20 MG PO TABS: 40.0000 mg | ORAL_TABLET | Freq: Every day | ORAL | Status: DC

## 2014-11-27 MED ORDER — POLYETHYLENE GLYCOL 3350 17 GM/SCOOP PO POWD
17.0000 g | Freq: Two times a day (BID) | ORAL | Status: DC
Start: 2014-11-27 — End: 2016-03-05

## 2014-11-27 NOTE — ED Provider Notes (Signed)
CSN: [id number]     Arrival date & time 11/27/14  1422 History  This chart was scribed for non-physician practitioner, [name], PA-C working with Tilden Fossa, MD by Doreatha Martin, ED scribe. This patient was seen in room WTR7/WTR7 and the patient's care was started at 2:41 PM     Chief Complaint  Patient presents with  . Back Pain   The history is provided by the patient. No language interpreter was used.    HPI Comments: Sarah Barr is a 34 y.o. female who presents to the Emergency Department with a chief complaint of moderate, gradual onset lower back pain that shoots down her left leg onset 2 weeks ago. She states associated constipation onset 3 days ago. She reports that she has tried Tylenol and Ibuprofen without relief.  Pt states that she tried to see her PCP but they have no appointments available. She also notes that she has attempted to see physical therapy but she has not yet received a referral. She denies falls, injuries, heavy lifting, and a Hx of DM. She also denies, dysuria, numbness, and incontinence x2.   Past Medical History  Diagnosis Date  . Anemia   . Vaginal Pap smear, abnormal   . Headache   . Chronic kidney disease 2008    kidney stones  . Infection     UTI's in pregnancy   Past Surgical History  Procedure Laterality Date  . Cholecystectomy    . Cesarean section      x's 3.   . Cyst removal hand     No family history on file. History  Substance Use Topics  . Smoking status: Current Some Day Smoker    Types: Cigarettes  . Smokeless tobacco: Never Used  . Alcohol Use: No   OB History    Gravida Para Term Preterm AB TAB SAB Ectopic Multiple Living   Review of Systems  Constitutional: Negative for fever and chills.  Gastrointestinal:       No bowel incontinence  Genitourinary:       No urinary incontinence  Musculoskeletal: Positive for myalgias, back pain and arthralgias.  Neurological:       No saddle anesthesia    Allergies  Review of patient's allergies indicates no known allergies.  Home Medications   Prior to Admission medications   Medication Sig Start Date End Date Taking? Authorizing Provider  ferrous sulfate 325 (65 FE) MG tablet Take 1 tablet (325 mg total) by mouth daily. 05/08/14   [name], PA-C  HYDROcodone-acetaminophen (HYCET) 7.5-325 mg/15 ml solution Take 15 mLs by mouth 4 (four) times daily as needed for pain or cough. 09/15/12   [name], PA-C  ibuprofen (ADVIL,MOTRIN) 800 MG tablet Take 1 tablet (800 mg total) by mouth 3 (three) times daily. 05/07/14   [name], PA-C   BP 143/83 mmHg  Pulse 68  Temp(Src) 98.6 F (37 C) (Oral)  Resp 16  SpO2 100%  LMP 10/31/2014 (Approximate) Physical Exam  Constitutional: She is oriented to person, place, and time. She appears well-developed and well-nourished. No distress.  HENT:  Head: Normocephalic and atraumatic.  Eyes: Conjunctivae and EOM are normal. Pupils are equal, round, and reactive to light. Right eye exhibits no discharge. Left eye exhibits no discharge. No scleral icterus.  Neck: Normal range of motion. Neck supple. No tracheal deviation present.  Cardiovascular: Normal rate, regular rhythm and normal heart sounds.  Exam reveals no gallop and no friction rub.   No murmur heard. Pulmonary/Chest: Effort normal and breath sounds normal. No respiratory distress. She has no wheezes.  Abdominal: Soft. She exhibits no distension. There is no tenderness.  Musculoskeletal: Normal range of motion.  Lumbar paraspinal muscles tender to palpation, no bony tenderness, step-offs, or gross abnormality or deformity of spine, patient is able to ambulate, moves all extremities  Bilateral great toe extension intact Bilateral plantar/dorsiflexion intact  Neurological: She is alert and oriented to person, place, and time. She has normal reflexes.  Sensation and strength intact bilaterally Symmetrical reflexes  Skin: Skin is warm  and dry. She is not diaphoretic.  Psychiatric: She has a normal mood and affect. Her behavior is normal. Judgment and thought content normal.  Nursing note and vitals reviewed.  ED Course  Procedures (including critical care time) DIAGNOSTIC STUDIES: Oxygen Saturation is 100% on RA, normal by my interpretation.    COORDINATION OF CARE: 2:50 PM Discussed treatment plan with pt at bedside and pt agreed to plan. Advised exercise and physical therapy to strengthen lower back and core muscles. Will prescribe prednisone and a stool softener. Advised pt to follow up with a back specialist if unable to see PCP or PT.   Labs Review Labs Reviewed - No data to display  Imaging Review No results found.   EKG Interpretation None      MDM   Final diagnoses:  Bilateral low back pain with sciatica, sciatica laterality unspecified    Patient with back pain.  No neurological deficits and normal neuro exam.  Patient is ambulatory.  No loss of bowel or bladder control.  Doubt cauda equina.  Denies fever,  doubt epidural abscess or other lesion. Recommend back exercises, stretching, RICE, and will treat with a short course of prednisone.  Encouraged the patient that there could be a need for additional workup and/or imaging such as MRI, if the symptoms do not resolve. Patient advised that if the back pain does not resolve, or radiates, this could progress to more serious conditions and is encouraged to follow-up with PCP or orthopedics within 2 weeks.    I personally performed the services described in this documentation, which was scribed in my presence. The recorded information has been reviewed and is accurate.    Roxy HorsemanRobert Deeandra Jerry, PA-C 11/27/14 1454  Tilden FossaElizabeth Rees, MD 11/27/14 1500

## 2014-11-27 NOTE — ED Notes (Signed)
Pt presents with c/o low back pain that shoots down her left leg. Pt reports hx of sciatica.

## 2014-11-27 NOTE — Discharge Instructions (Signed)
Sciatica Sciatica is pain, weakness, numbness, or tingling along the path of the sciatic nerve. The nerve starts in the lower back and runs down the back of each leg. The nerve controls the muscles in the lower leg and in the back of the knee, while also providing sensation to the back of the thigh, lower leg, and the sole of your foot. Sciatica is a symptom of another medical condition. For instance, nerve damage or certain conditions, such as a herniated disk or bone spur on the spine, pinch or put pressure on the sciatic nerve. This causes the pain, weakness, or other sensations normally associated with sciatica. Generally, sciatica only affects one side of the body. CAUSES   Herniated or slipped disc.  Degenerative disk disease.  A pain disorder involving the narrow muscle in the buttocks (piriformis syndrome).  Pelvic injury or fracture.  Pregnancy.  Tumor (rare). SYMPTOMS  Symptoms can vary from mild to very severe. The symptoms usually travel from the low back to the buttocks and down the back of the leg. Symptoms can include:  Mild tingling or dull aches in the lower back, leg, or hip.  Numbness in the back of the calf or sole of the foot.  Burning sensations in the lower back, leg, or hip.  Sharp pains in the lower back, leg, or hip.  Leg weakness.  Severe back pain inhibiting movement. These symptoms may get worse with coughing, sneezing, laughing, or prolonged sitting or standing. Also, being overweight may worsen symptoms. DIAGNOSIS  Your caregiver will perform a physical exam to look for common symptoms of sciatica. He or she may ask you to do certain movements or activities that would trigger sciatic nerve pain. Other tests may be performed to find the cause of the sciatica. These may include:  Blood tests.  X-rays.  Imaging tests, such as an MRI or CT scan. TREATMENT  Treatment is directed at the cause of the sciatic pain. Sometimes, treatment is not necessary  and the pain and discomfort goes away on its own. If treatment is needed, your caregiver may suggest:  Over-the-counter medicines to relieve pain.  Prescription medicines, such as anti-inflammatory medicine, muscle relaxants, or narcotics.  Applying heat or ice to the painful area.  Steroid injections to lessen pain, irritation, and inflammation around the nerve.  Reducing activity during periods of pain.  Exercising and stretching to strengthen your abdomen and improve flexibility of your spine. Your caregiver may suggest losing weight if the extra weight makes the back pain worse.  Physical therapy.  Surgery to eliminate what is pressing or pinching the nerve, such as a bone spur or part of a herniated disk. HOME CARE INSTRUCTIONS   Only take over-the-counter or prescription medicines for pain or discomfort as directed by your caregiver.  Apply ice to the affected area for 20 minutes, 3-4 times a day for the first 48-72 hours. Then try heat in the same way.  Exercise, stretch, or perform your usual activities if these do not aggravate your pain.  Attend physical therapy sessions as directed by your caregiver.  Keep all follow-up appointments as directed by your caregiver.  Do not wear high heels or shoes that do not provide proper support.  Check your mattress to see if it is too soft. A firm mattress may lessen your pain and discomfort. SEEK IMMEDIATE MEDICAL CARE IF:   You lose control of your bowel or bladder (incontinence).  You have increasing weakness in the lower back, pelvis, buttocks,   or legs.  You have redness or swelling of your back.  You have a burning sensation when you urinate.  You have pain that gets worse when you lie down or awakens you at night.  Your pain is worse than you have experienced in the past.  Your pain is lasting longer than 4 weeks.  You are suddenly losing weight without reason. MAKE SURE YOU:  Understand these  instructions.  Will watch your condition.  Will get help right away if you are not doing well or get worse. Document Released: 06/01/2001 Document Revised: 12/07/2011 Document Reviewed: 10/17/2011 [employer] Patient Information 2015 [employer]. This information is not intended to replace advice given to you by your health care provider. Make sure you discuss any questions you have with your health care provider.  

## 2015-06-20 ENCOUNTER — Encounter (HOSPITAL_COMMUNITY): Payer: Self-pay | Admitting: Emergency Medicine

## 2015-06-20 ENCOUNTER — Emergency Department (HOSPITAL_COMMUNITY)
Admission: EM | Admit: 2015-06-20 | Discharge: 2015-06-21 | Discharge status: Against Medical Advice | Payer: Medicaid Other | Attending: Emergency Medicine | Admitting: Emergency Medicine

## 2015-06-20 DIAGNOSIS — R51 Headache: Secondary | ICD-10-CM | POA: Diagnosis present

## 2015-06-20 DIAGNOSIS — F17210 Nicotine dependence, cigarettes, uncomplicated: Secondary | ICD-10-CM | POA: Insufficient documentation

## 2015-06-20 DIAGNOSIS — N189 Chronic kidney disease, unspecified: Secondary | ICD-10-CM | POA: Diagnosis not present

## 2015-06-20 DIAGNOSIS — R112 Nausea with vomiting, unspecified: Secondary | ICD-10-CM | POA: Insufficient documentation

## 2015-06-20 DIAGNOSIS — H53149 Visual discomfort, unspecified: Secondary | ICD-10-CM | POA: Diagnosis not present

## 2015-06-20 NOTE — ED Notes (Signed)
Patient presents for right sided HA, emesis x2 episodes yesterday, nausea, sensitivity to light and sound x2 days. Denies neck pain, lightheadedness, dizziness, double or blurred vision.

## 2015-07-28 ENCOUNTER — Emergency Department (HOSPITAL_COMMUNITY): Payer: Medicaid Other

## 2015-07-28 ENCOUNTER — Emergency Department (HOSPITAL_COMMUNITY)
Admission: EM | Admit: 2015-07-28 | Discharge: 2015-07-28 | Disposition: A | Discharge status: Home / Self Care | Payer: Medicaid Other | Attending: Emergency Medicine | Admitting: Emergency Medicine

## 2015-07-28 ENCOUNTER — Encounter (HOSPITAL_COMMUNITY): Payer: Self-pay

## 2015-07-28 DIAGNOSIS — Z7952 Long term (current) use of systemic steroids: Secondary | ICD-10-CM | POA: Diagnosis not present

## 2015-07-28 DIAGNOSIS — S3992XA Unspecified injury of lower back, initial encounter: Secondary | ICD-10-CM | POA: Diagnosis present

## 2015-07-28 DIAGNOSIS — D649 Anemia, unspecified: Secondary | ICD-10-CM | POA: Diagnosis not present

## 2015-07-28 DIAGNOSIS — Z8619 Personal history of other infectious and parasitic diseases: Secondary | ICD-10-CM | POA: Diagnosis not present

## 2015-07-28 DIAGNOSIS — Z3202 Encounter for pregnancy test, result negative: Secondary | ICD-10-CM | POA: Insufficient documentation

## 2015-07-28 DIAGNOSIS — Z87440 Personal history of urinary (tract) infections: Secondary | ICD-10-CM | POA: Insufficient documentation

## 2015-07-28 DIAGNOSIS — Y9289 Other specified places as the place of occurrence of the external cause: Secondary | ICD-10-CM | POA: Diagnosis not present

## 2015-07-28 DIAGNOSIS — Y999 Unspecified external cause status: Secondary | ICD-10-CM | POA: Diagnosis not present

## 2015-07-28 DIAGNOSIS — N189 Chronic kidney disease, unspecified: Secondary | ICD-10-CM | POA: Diagnosis not present

## 2015-07-28 DIAGNOSIS — Z87442 Personal history of urinary calculi: Secondary | ICD-10-CM | POA: Diagnosis not present

## 2015-07-28 DIAGNOSIS — M549 Dorsalgia, unspecified: Secondary | ICD-10-CM

## 2015-07-28 DIAGNOSIS — W01198A Fall on same level from slipping, tripping and stumbling with subsequent striking against other object, initial encounter: Secondary | ICD-10-CM | POA: Diagnosis not present

## 2015-07-28 DIAGNOSIS — F17210 Nicotine dependence, cigarettes, uncomplicated: Secondary | ICD-10-CM | POA: Insufficient documentation

## 2015-07-28 DIAGNOSIS — Z79899 Other long term (current) drug therapy: Secondary | ICD-10-CM | POA: Insufficient documentation

## 2015-07-28 DIAGNOSIS — Y9389 Activity, other specified: Secondary | ICD-10-CM | POA: Insufficient documentation

## 2015-07-28 LAB — POC URINE PREG, ED: PREG TEST UR: NEGATIVE

## 2015-07-28 MED ORDER — IBUPROFEN 400 MG PO TABS
800.0000 mg | ORAL_TABLET | Freq: Once | ORAL | Status: AC
  Administered 2015-07-28: 800 mg via ORAL
  Filled 2015-07-28: qty 2

## 2015-07-28 MED ORDER — IBUPROFEN 800 MG PO TABS: 800.0000 mg | ORAL_TABLET | Freq: Three times a day (TID) | ORAL | Status: DC

## 2015-07-28 MED ORDER — METHOCARBAMOL 500 MG PO TABS: 500.0000 mg | ORAL_TABLET | Freq: Two times a day (BID) | ORAL | Status: DC

## 2015-07-28 NOTE — ED Provider Notes (Signed)
CSN: [id number]     Arrival date & time 07/28/15  1420 History   By signing my name below, I, Sarah Barr, attest that this documentation has been prepared under the direction and in the presence of [name], PA-C. [name] Electronically Signed: [name], ED Scribe 07/28/2015 at 6:25 PM.    Chief Complaint  Patient presents with  . Back Pain    HPI   HPI Comments: Sarah Barr is a 35 y.o. female with PMHx of degenerative disc disease who presents to the Emergency Department complaining of sudden onset, worsening, constant lower left back pain, onset two days ago after falling and landing on her buttocks. She denies LOC or hitting her head when she fell. She states that the pain is worsened and becomes sharp with movement. She also notes the pain occasionally radiates into her left neck and left leg with movement. Pain is worsened with prolonged sitting, standing, or walking. Pt has used a heating pad at home with minimal relief to symptoms. Pt denies urinary/bowel incontinence, saddle anesthesia, history of malignancy, IVDU, anticoagulant use, numbness, weakness, paresthesia.   Past Medical History  Diagnosis Date  . Anemia   . Vaginal Pap smear, abnormal   . Headache   . Chronic kidney disease 2008    kidney stones  . Infection     UTI's in pregnancy   Past Surgical History  Procedure Laterality Date  . Cholecystectomy    . Cesarean section      x's 3.   . Cyst removal hand     No family history on file. Social History  Substance Use Topics  . Smoking status: Current Some Day Smoker    Types: Cigarettes  . Smokeless tobacco: Never Used  . Alcohol Use: No   OB History    Gravida Para Term Preterm AB TAB SAB Ectopic Multiple Living   Review of Systems  Constitutional: Negative for fever and chills.  Gastrointestinal: Negative for abdominal pain.  Musculoskeletal: Positive for back pain. Negative for gait problem, neck pain  and neck stiffness.  Neurological: Negative for weakness and numbness.      Allergies  Review of patient's allergies indicates no known allergies.  Home Medications   Prior to Admission medications   Medication Sig Start Date End Date Taking? Authorizing Provider  ferrous sulfate 325 (65 FE) MG tablet Take 1 tablet (325 mg total) by mouth daily. 05/08/14   [name], PA-C  HYDROcodone-acetaminophen (HYCET) 7.5-325 mg/15 ml solution Take 15 mLs by mouth 4 (four) times daily as needed for pain or cough. 09/15/12   [name], PA-C  ibuprofen (ADVIL,MOTRIN) 800 MG tablet Take 1 tablet (800 mg total) by mouth 3 (three) times daily. 07/28/15   [name], PA-C  methocarbamol (ROBAXIN) 500 MG tablet Take 1 tablet (500 mg total) by mouth 2 (two) times daily. 07/28/15   [name], PA-C  polyethylene glycol powder (GLYCOLAX/MIRALAX) powder Take 17 g by mouth 2 (two) times daily. Until daily soft stools  OTC 11/27/14   [name], PA-C  predniSONE (DELTASONE) 20 MG tablet Take 2 tablets (40 mg total) by mouth daily. 11/27/14   [name], PA-C    BP 118/81 mmHg  Pulse 83  Temp(Src) 98.6 F (37 C) (Oral)  Resp 16  SpO2 100%  LMP 07/23/2015 Physical Exam  Constitutional: She is oriented to person, place, and time.  She appears well-developed and well-nourished. No distress.  HENT:  Head: Normocephalic and atraumatic.  Right Ear: External ear normal.  Left Ear: External ear normal.  Nose: Nose normal.  Eyes: Conjunctivae and EOM are normal. Right eye exhibits no discharge. Left eye exhibits no discharge. No scleral icterus.  Neck: Normal range of motion. Neck supple.  Cardiovascular: Normal rate and regular rhythm.   Pulmonary/Chest: Effort normal and breath sounds normal. No respiratory distress.  Musculoskeletal: Normal range of motion. She exhibits tenderness. She exhibits no edema.       Lumbar back: She exhibits tenderness. She exhibits no edema and no  deformity.  TTP to lumbar spine and left lumbar paraspinal muscles. No palpable step off or deformity.   Neurological: She is alert and oriented to person, place, and time. She has normal reflexes.  Ambulates without difficulty.   Skin: Skin is warm and dry. She is not diaphoretic.  Psychiatric: She has a normal mood and affect. Her behavior is normal.  Nursing note and vitals reviewed.   ED Course  Procedures (including critical care time)  DIAGNOSTIC STUDIES: Oxygen Saturation is 100% on RA, normal by my interpretation.    COORDINATION OF CARE: 3:45 PM-Discussed treatment plan which includes DG lumbar spine complete with pt at bedside and pt agreed to plan.   Labs Review Labs Reviewed  POC URINE PREG, ED    Imaging Review Dg Lumbar Spine Complete  07/28/2015  CLINICAL DATA:  Left-sided low back pain and left leg pain since a fall 4 days ago. EXAM: LUMBAR SPINE - COMPLETE 4+ VIEW COMPARISON:  03/26/2011 FINDINGS: There is no fracture or subluxation or bone destruction. There is increased narrowing of the L5-S1 disc space with slight sclerosis of the endplates at that level. Rest of the lumbar spine appears normal. No facet arthritis. IMPRESSION: No acute abnormalities. Progressive degenerative disc disease at L5-S1. Electronically Signed   By: [name] M.D.   On: 07/28/2015 16:21   I have personally reviewed and evaluated these images and lab results as part of my medical decision-making.   EKG Interpretation None      MDM   Final diagnoses:  Back pain, unspecified location    35 year old female presents with low back pain s/p fall. Denies bowel or bladder incontinence, saddle anesthesia, history of malignancy, IVDU, anticoagulant use, numbness, weakness, paresthesia. Patient is afebrile. Vital signs stable. On exam, she has TTP of her lumbar spine and left lumbar paraspinal muscles. No palpable step-off or deformity. Strength, sensation, DTRs intact. Patient ambulates  without difficulty. Imaging of lumbar spine negative for acute abnormalities, reveals progressive degenerative disc disease at L5-S1. Discussed findings with patient. Will treat with ibuprofen and robaxin. Patient stable for discharge at this time. Patient to follow-up with PCP. Return precautions discussed. Patient verbalizes her understanding and is in agreement with plan.  BP 118/81 mmHg  Pulse 83  Temp(Src) 98.6 F (37 C) (Oral)  Resp 16  SpO2 100%  LMP 07/23/2015  I personally performed the services described in this documentation, which was scribed in my presence. The recorded information has been reviewed and is accurate.     [name], PA-C 07/28/15 1829  Raeford Razor, MD 07/29/15 [phone]

## 2015-07-28 NOTE — ED Notes (Signed)
Declined W/C at D/C and was escorted to lobby by RN. 

## 2015-07-28 NOTE — ED Notes (Signed)
Pt states she slipped and fell two days ago and landed on her bottom. She has been having lower back that radiates to upper back and left leg; pain since then. In December she was told she has DDD and is wondering if the fall made it worse. Denies LOC or head injury.

## 2015-07-28 NOTE — Discharge Instructions (Signed)
1. Medications: ibuprofen, robaxin, usual home medications 2. Treatment: rest, drink plenty of fluids 3. Follow Up: please followup with your primary doctor for discussion of your diagnoses and further evaluation after today's visit; if you do not have a primary care doctor use the resource guide provided to find one; please return to the ER for increased pain, weakness, numbness, loss of control of your bowel or bladder, new or worsening symptoms   Back Exercises The following exercises strengthen the muscles that help to support the back. They also help to keep the lower back flexible. Doing these exercises can help to prevent back pain or lessen existing pain. If you have back pain or discomfort, try doing these exercises 2-3 times each day or as told by your health care provider. When the pain goes away, do them once each day, but increase the number of times that you repeat the steps for each exercise (do more repetitions). If you do not have back pain or discomfort, do these exercises once each day or as told by your health care provider. EXERCISES Single Knee to Chest Repeat these steps 3-5 times for each leg:  Lie on your back on a firm bed or the floor with your legs extended.  Bring one knee to your chest. Your other leg should stay extended and in contact with the floor.  Hold your knee in place by grabbing your knee or thigh.  Pull on your knee until you feel a gentle stretch in your lower back.  Hold the stretch for 10-30 seconds.  Slowly release and straighten your leg. Pelvic Tilt Repeat these steps 5-10 times:  Lie on your back on a firm bed or the floor with your legs extended.  Bend your knees so they are pointing toward the ceiling and your feet are flat on the floor.  Tighten your lower abdominal muscles to press your lower back against the floor. This motion will tilt your pelvis so your tailbone points up toward the ceiling instead of pointing to your feet or the  floor.  With gentle tension and even breathing, hold this position for 5-10 seconds. Cat-Cow Repeat these steps until your lower back becomes more flexible:  Get into a hands-and-knees position on a firm surface. Keep your hands under your shoulders, and keep your knees under your hips. You may place padding under your knees for comfort.  Let your head hang down, and point your tailbone toward the floor so your lower back becomes rounded like the back of a cat.  Hold this position for 5 seconds.  Slowly lift your head and point your tailbone up toward the ceiling so your back forms a sagging arch like the back of a cow.  Hold this position for 5 seconds. Press-Ups Repeat these steps 5-10 times:  Lie on your abdomen (face-down) on the floor.  Place your palms near your head, about shoulder-width apart.  While you keep your back as relaxed as possible and keep your hips on the floor, slowly straighten your arms to raise the top half of your body and lift your shoulders. Do not use your back muscles to raise your upper torso. You may adjust the placement of your hands to make yourself more comfortable.  Hold this position for 5 seconds while you keep your back relaxed.  Slowly return to lying flat on the floor. Bridges Repeat these steps 10 times: 1. Lie on your back on a firm surface. 2. Bend your knees so they are  pointing toward the ceiling and your feet are flat on the floor. 3. Tighten your buttocks muscles and lift your buttocks off of the floor until your waist is at almost the same height as your knees. You should feel the muscles working in your buttocks and the back of your thighs. If you do not feel these muscles, slide your feet 1-2 inches farther away from your buttocks. 4. Hold this position for 3-5 seconds. 5. Slowly lower your hips to the starting position, and allow your buttocks muscles to relax completely. If this exercise is too easy, try doing it with your arms  crossed over your chest. Abdominal Crunches Repeat these steps 5-10 times: 1. Lie on your back on a firm bed or the floor with your legs extended. 2. Bend your knees so they are pointing toward the ceiling and your feet are flat on the floor. 3. Cross your arms over your chest. 4. Tip your chin slightly toward your chest without bending your neck. 5. Tighten your abdominal muscles and slowly raise your trunk (torso) high enough to lift your shoulder blades a tiny bit off of the floor. Avoid raising your torso higher than that, because it can put too much stress on your low back and it does not help to strengthen your abdominal muscles. 6. Slowly return to your starting position. Back Lifts Repeat these steps 5-10 times: 1. Lie on your abdomen (face-down) with your arms at your sides, and rest your forehead on the floor. 2. Tighten the muscles in your legs and your buttocks. 3. Slowly lift your chest off of the floor while you keep your hips pressed to the floor. Keep the back of your head in line with the curve in your back. Your eyes should be looking at the floor. 4. Hold this position for 3-5 seconds. 5. Slowly return to your starting position. SEEK MEDICAL CARE IF:  Your back pain or discomfort gets much worse when you do an exercise.  Your back pain or discomfort does not lessen within 2 hours after you exercise. If you have any of these problems, stop doing these exercises right away. Do not do them again unless your health care provider says that you can. SEEK IMMEDIATE MEDICAL CARE IF:  You develop sudden, severe back pain. If this happens, stop doing the exercises right away. Do not do them again unless your health care provider says that you can.   This information is not intended to replace advice given to you by your health care provider. Make sure you discuss any questions you have with your health care provider.   Document Released: 07/15/2004 Document Revised: 02/26/2015  Document Reviewed: 08/01/2014 Elsevier Interactive Patient Education 2016 [employer]  Back Pain, Adult Back pain is very common in adults.The cause of back pain is rarely dangerous and the pain often gets better over time.The cause of your back pain may not be known. Some common causes of back pain include:  Strain of the muscles or ligaments supporting the spine.  Wear and tear (degeneration) of the spinal disks.  Arthritis.  Direct injury to the back. For many people, back pain may return. Since back pain is rarely dangerous, most people can learn to manage this condition on their own. HOME CARE INSTRUCTIONS Watch your back pain for any changes. The following actions may help to lessen any discomfort you are feeling:  Remain active. It is stressful on your back to sit or stand in one place for long periods  of time. Do not sit, drive, or stand in one place for more than 30 minutes at a time. Take short walks on even surfaces as soon as you are able.Try to increase the length of time you walk each day.  Exercise regularly as directed by your health care provider. Exercise helps your back heal faster. It also helps avoid future injury by keeping your muscles strong and flexible.  Do not stay in bed.Resting more than 1-2 days can delay your recovery.  Pay attention to your body when you bend and lift. The most comfortable positions are those that put less stress on your recovering back. Always use proper lifting techniques, including:  Bending your knees.  Keeping the load close to your body.  Avoiding twisting.  Find a comfortable position to sleep. Use a firm mattress and lie on your side with your knees slightly bent. If you lie on your back, put a pillow under your knees.  Avoid feeling anxious or stressed.Stress increases muscle tension and can worsen back pain.It is important to recognize when you are anxious or stressed and learn ways to manage it, such as with  exercise.  Take medicines only as directed by your health care provider. Over-the-counter medicines to reduce pain and inflammation are often the most helpful.Your health care provider may prescribe muscle relaxant drugs.These medicines help dull your pain so you can more quickly return to your normal activities and healthy exercise.  Apply ice to the injured area:  Put ice in a plastic bag.  Place a towel between your skin and the bag.  Leave the ice on for 20 minutes, 2-3 times a day for the first 2-3 days. After that, ice and heat may be alternated to reduce pain and spasms.  Maintain a healthy weight. Excess weight puts extra stress on your back and makes it difficult to maintain good posture. SEEK MEDICAL CARE IF:  You have pain that is not relieved with rest or medicine.  You have increasing pain going down into the legs or buttocks.  You have pain that does not improve in one week.  You have night pain.  You lose weight.  You have a fever or chills. SEEK IMMEDIATE MEDICAL CARE IF:   You develop new bowel or bladder control problems.  You have unusual weakness or numbness in your arms or legs.  You develop nausea or vomiting.  You develop abdominal pain.  You feel faint.   This information is not intended to replace advice given to you by your health care provider. Make sure you discuss any questions you have with your health care provider.   Document Released: 06/07/2005 Document Revised: 06/28/2014 Document Reviewed: 10/09/2013 Elsevier Interactive Patient Education [employer]

## 2015-10-31 IMAGING — US US TRANSVAGINAL NON-OB
1 series · 14 of 25 positions shown · non-contrast
Comparison: None

CLINICAL DATA: Abnormal uterine bleeding.

EXAM:
TRANSABDOMINAL AND TRANSVAGINAL ULTRASOUND OF PELVIS
TECHNIQUE: Both transabdominal and transvaginal ultrasound examinations of the
pelvis were performed. Transabdominal technique was performed for
global imaging of the pelvis including uterus, ovaries, adnexal
regions, and pelvic cul-de-sac. It was necessary to proceed with
endovaginal exam following the transabdominal exam to visualize the
uterus and endometrium.

[Series 1: us pelvis complete · 14 of 50 slices shown]
[im 1/50]
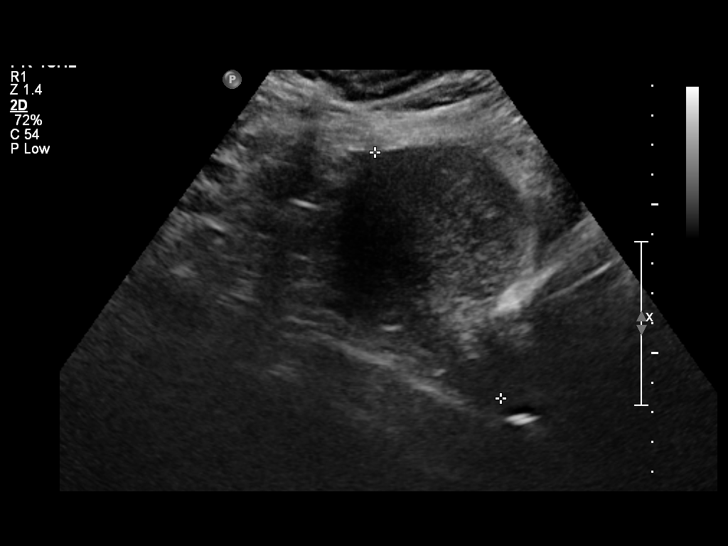
[im 5/50]
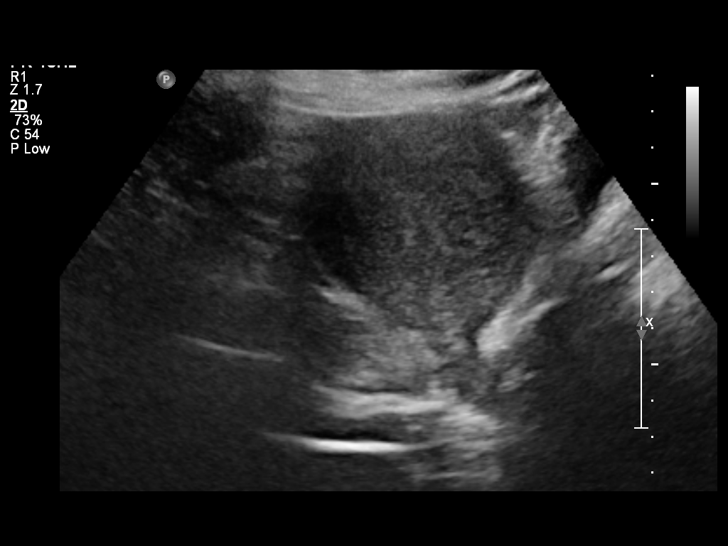
[im 9/50]
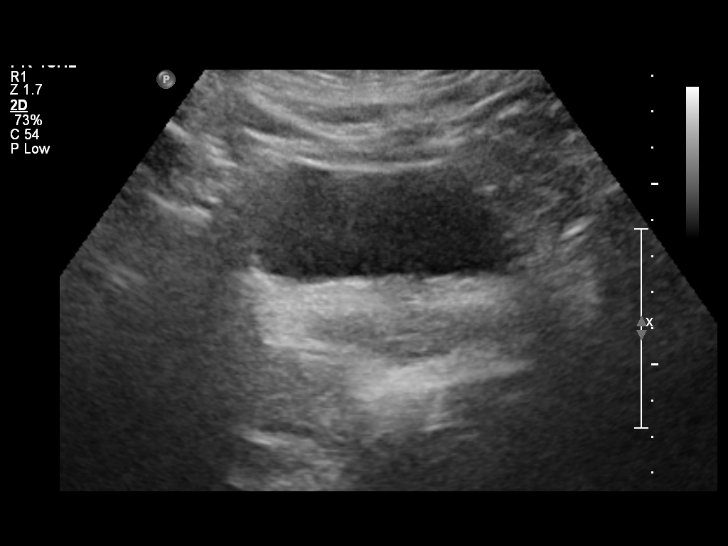
[im 13/50]
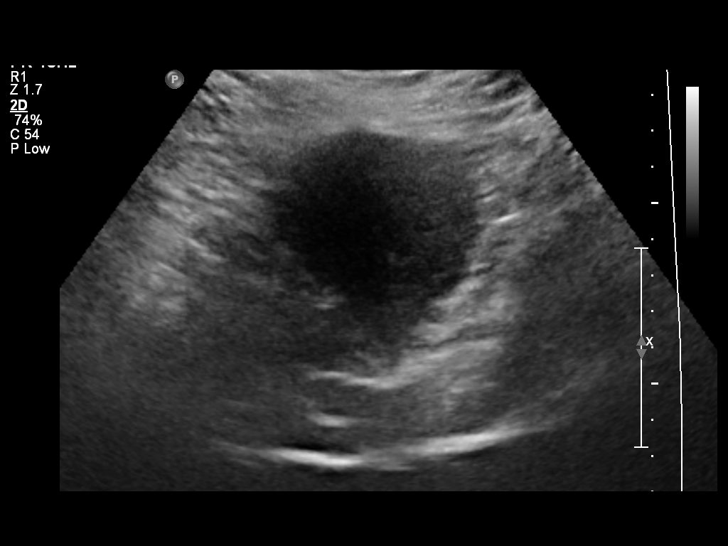
[im 17/50]
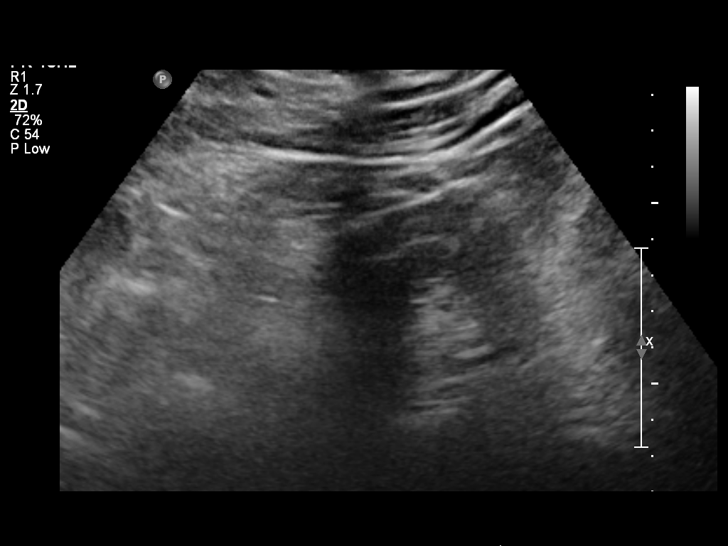
[im 19/50]
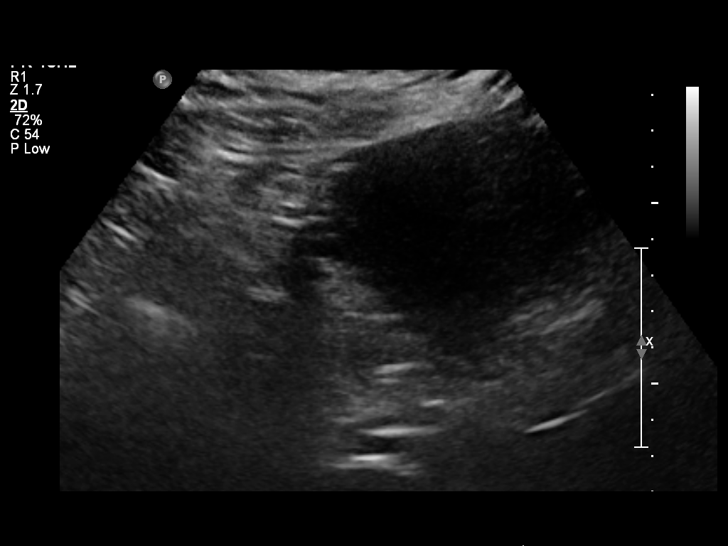
[im 23/50]
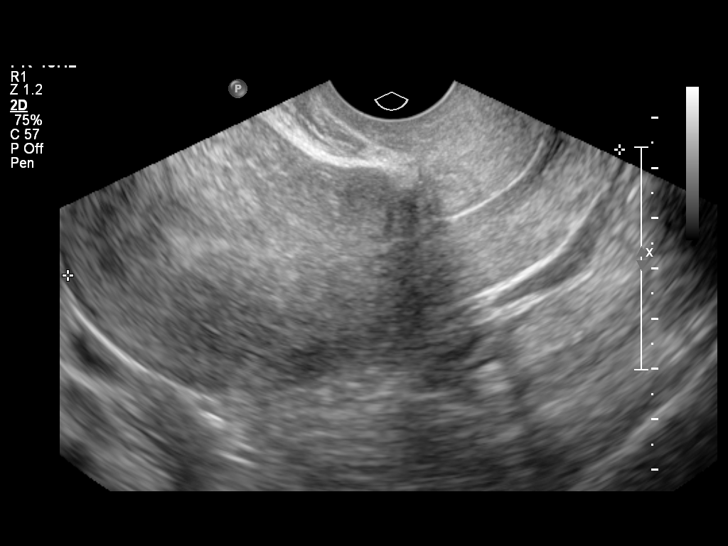
[im 27/50]
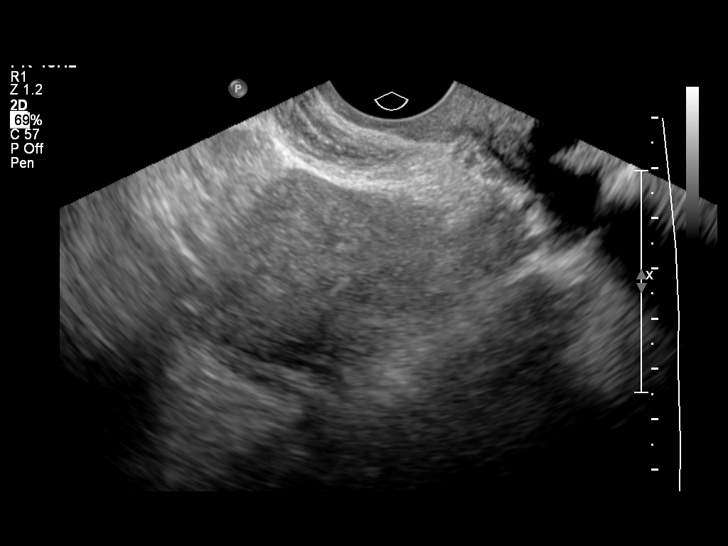
[im 31/50]
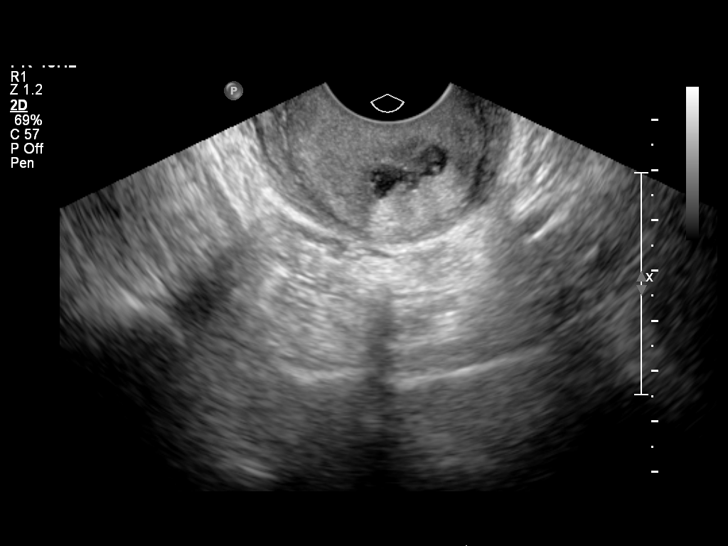
[im 33/50]
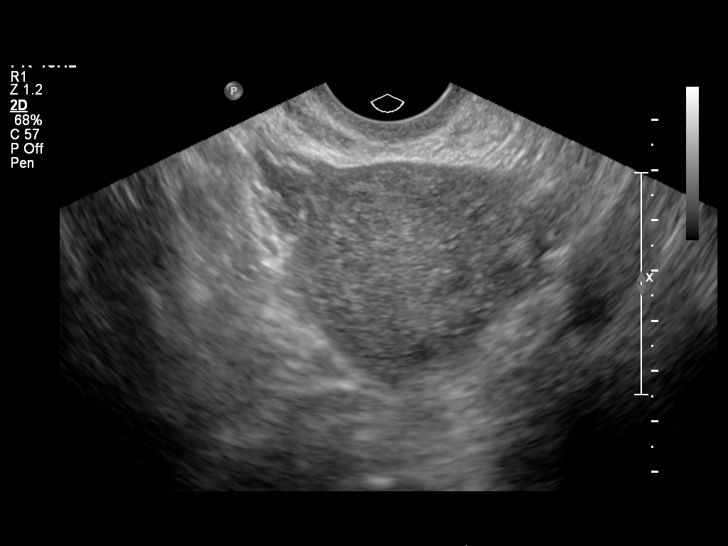
[im 37/50]
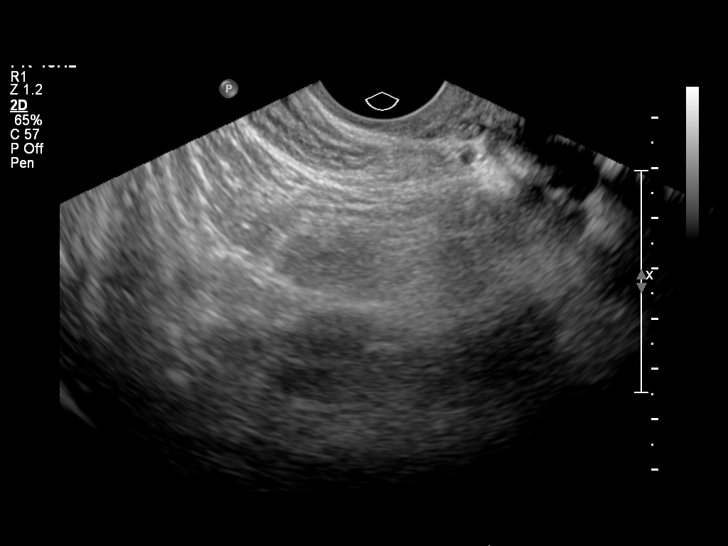
[im 41/50]
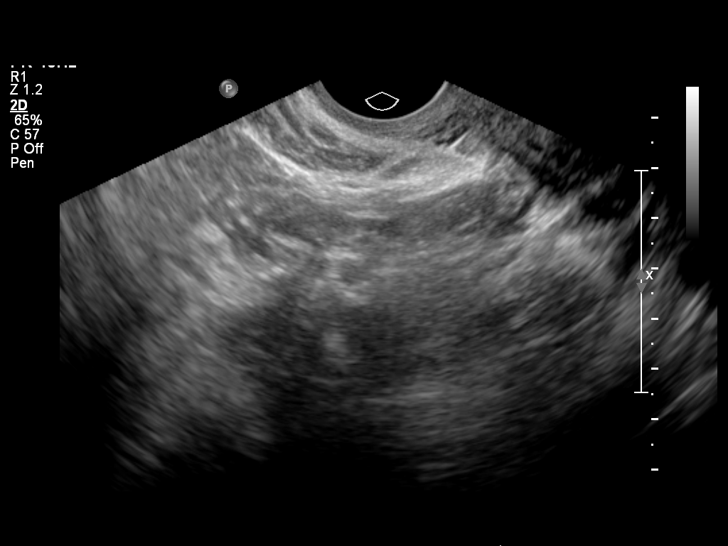
[im 45/50]
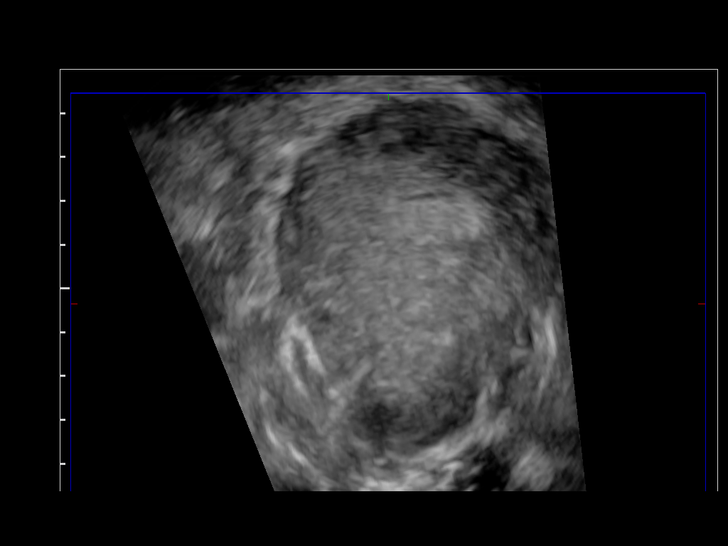
[im 50/50]
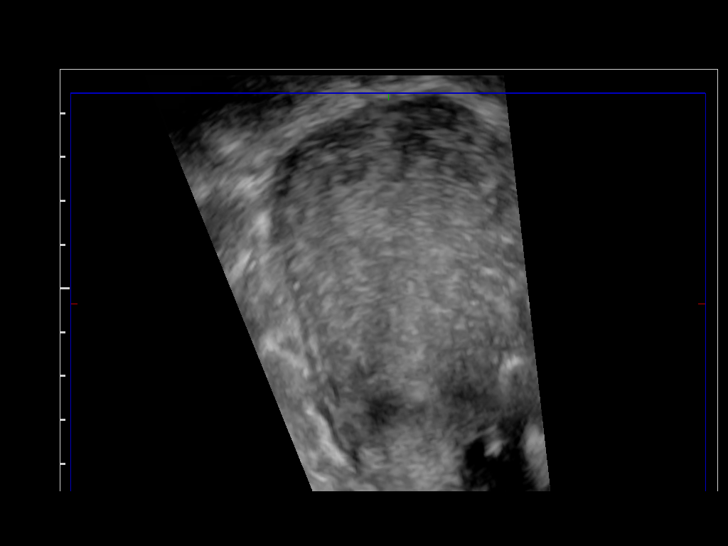

[14 of 25 positions shown; findings below may reference images not displayed]

FINDINGS: Uterus

Measurements: 11.3 x 5.4 x 6.8 cm, anteverted. Uterus is somewhat
enlarged diffusely but no focal lesions are identified. Small
nabothian cysts in the cervix.

Endometrium

Thickness: 5.5 mm.  No focal abnormality visualized.

Right ovary

Measurements: 2.5 x 1.9 x 1.5 cm. Limited visualization on
transabdominal images. Not seen transvaginally. No abnormal adnexal
masses suspected.

Left ovary

Left ovary is not visualized.

Other findings

No free fluid.
IMPRESSION: Uterus is mildly enlarged but no focal lesions are identified.
Normal endometrial stripe thickness and appearance. Right ovary
appears normal. Left ovary not visualized.

## 2016-03-04 ENCOUNTER — Encounter (HOSPITAL_COMMUNITY): Payer: Self-pay | Admitting: *Deleted

## 2016-03-04 ENCOUNTER — Emergency Department (HOSPITAL_COMMUNITY)
Admission: EM | Admit: 2016-03-04 | Discharge: 2016-03-05 | Disposition: A | Discharge status: Home / Self Care | Payer: Medicaid Other | Attending: Emergency Medicine | Admitting: Emergency Medicine

## 2016-03-04 DIAGNOSIS — R1013 Epigastric pain: Secondary | ICD-10-CM

## 2016-03-04 DIAGNOSIS — D509 Iron deficiency anemia, unspecified: Secondary | ICD-10-CM

## 2016-03-04 DIAGNOSIS — R112 Nausea with vomiting, unspecified: Secondary | ICD-10-CM

## 2016-03-04 DIAGNOSIS — N189 Chronic kidney disease, unspecified: Secondary | ICD-10-CM | POA: Insufficient documentation

## 2016-03-04 DIAGNOSIS — F17210 Nicotine dependence, cigarettes, uncomplicated: Secondary | ICD-10-CM | POA: Insufficient documentation

## 2016-03-04 LAB — COMPREHENSIVE METABOLIC PANEL
ALT: 12 U/L — ABNORMAL LOW (ref 14–54)
ANION GAP: 8 (ref 5–15)
AST: 19 U/L (ref 15–41)
Albumin: 3.7 g/dL (ref 3.5–5.0)
Alkaline Phosphatase: 55 U/L (ref 38–126)
BILIRUBIN TOTAL: 0.4 mg/dL (ref 0.3–1.2)
BUN: 8 mg/dL (ref 6–20)
CO2: 21 mmol/L — ABNORMAL LOW (ref 22–32)
Calcium: 8.9 mg/dL (ref 8.9–10.3)
Chloride: 109 mmol/L (ref 101–111)
Creatinine, Ser: 0.69 mg/dL (ref 0.44–1.00)
GFR calc Af Amer: >60 mL/min (ref >60)
Glucose, Bld: 90 mg/dL (ref 65–99)
Potassium: 3.9 mmol/L (ref 3.5–5.1)
Sodium: 138 mmol/L (ref 135–145)
TOTAL PROTEIN: 6.8 g/dL (ref 6.5–8.1)

## 2016-03-04 LAB — CBC
HCT: 31.3 % — ABNORMAL LOW (ref 36.0–46.0)
HEMOGLOBIN: 9.5 g/dL — AB (ref 12.0–15.0)
MCH: 21.1 pg — ABNORMAL LOW (ref 26.0–34.0)
MCHC: 30.4 g/dL (ref 30.0–36.0)
MCV: 69.4 fL — ABNORMAL LOW (ref 78.0–100.0)
Platelets: 397 10*3/uL (ref 150–400)
RBC: 4.51 MIL/uL (ref 3.87–5.11)
RDW: 19 % — AB (ref 11.5–15.5)
WBC: 7.5 10*3/uL (ref 4.0–10.5)

## 2016-03-04 LAB — I-STAT BETA HCG BLOOD, ED (MC, WL, AP ONLY)

## 2016-03-04 LAB — LIPASE, BLOOD: Lipase: 23 U/L (ref 11–51)

## 2016-03-04 NOTE — ED Provider Notes (Signed)
[facility]-EMERGENCY DEPT Provider Note   CSN: [id number] Arrival date & time: 03/04/16  1721 By signing my name below, I, Levon HedgerElizabeth Hall, attest that this documentation has been prepared under the direction and in the presence of Dione Boozeavid Tawn Fitzner, MD . Electronically Signed: Levon HedgerElizabeth Hall, Scribe. 03/04/2016. 12:14 AM.   History   Chief Complaint Chief Complaint  Patient presents with  . Emesis    HPI Sarah Barr is a 35 y.o. female who presents to the Emergency Department complaining of moderate epigastric pain which does not radiate onset today. She describes epigastric pain as aching and 8/10 in severity. No alleviating or modifying factors noted. She also notes associated nausea, vomiting, and sore throat.  nauseous and "felt a bump in my throat". She vomited and had red emesis. Her headache, nausea, and vomiting have since resolved. Pt states she had taken 1-2 ibuprofen per day last week. Pt is followed by PCP Dr. Julio Sickssei-Bonsu.   The history is provided by the patient. No language interpreter was used.   Past Medical History:  Diagnosis Date  . Anemia   . Chronic kidney disease 2008   kidney stones  . Headache   . Infection    UTI's in pregnancy  . Vaginal Pap smear, abnormal     There are no active problems to display for this patient.   Past Surgical History:  Procedure Laterality Date  . CESAREAN SECTION     x's 3.   . CHOLECYSTECTOMY    . CYST REMOVAL HAND      OB History    Gravida Para Term Preterm AB Living   4       1 3    SAB TAB Ectopic Multiple Live Births     1             Home Medications    Prior to Admission medications   Medication Sig Start Date End Date Taking? Authorizing Provider  ferrous sulfate 325 (65 FE) MG tablet Take 1 tablet (325 mg total) by mouth daily. 05/08/14   Marny LowensteinJulie N Wenzel, PA-C  HYDROcodone-acetaminophen (HYCET) 7.5-325 mg/15 ml solution Take 15 mLs by mouth 4 (four) times daily as needed for pain or cough. 09/15/12   Fayrene HelperBowie Tran,  PA-C  ibuprofen (ADVIL,MOTRIN) 800 MG tablet Take 1 tablet (800 mg total) by mouth 3 (three) times daily. 07/28/15   Mady GemmaElizabeth C Westfall, PA-C  methocarbamol (ROBAXIN) 500 MG tablet Take 1 tablet (500 mg total) by mouth 2 (two) times daily. 07/28/15   Mady GemmaElizabeth C Westfall, PA-C  polyethylene glycol powder (GLYCOLAX/MIRALAX) powder Take 17 g by mouth 2 (two) times daily. Until daily soft stools  OTC 11/27/14   Roxy Horsemanobert Browning, PA-C  predniSONE (DELTASONE) 20 MG tablet Take 2 tablets (40 mg total) by mouth daily. 11/27/14   Roxy Horsemanobert Browning, PA-C    Family History No family history on file.  Social History Social History  Substance Use Topics  . Smoking status: Current Some Day Smoker    Types: Cigarettes  . Smokeless tobacco: Never Used  . Alcohol use No     Allergies   Review of patient's allergies indicates no known allergies.   Review of Systems Review of Systems  All other systems reviewed and are negative.  Physical Exam Updated Vital Signs BP 139/95   Pulse 86   Temp 98.3 F (36.8 C) (Oral)   Resp 18   LMP 03/02/2016   SpO2 100%   Physical Exam  Constitutional: She is oriented to person, place,  and time. She appears well-developed and well-nourished.  HENT:  Head: Normocephalic and atraumatic.  Eyes: EOM are normal. Pupils are equal, round, and reactive to light.  Neck: Normal range of motion. Neck supple. No JVD present.  Cardiovascular: Normal rate, regular rhythm and normal heart sounds.   No murmur heard. Pulmonary/Chest: Effort normal and breath sounds normal. She has no wheezes. She has no rales. She exhibits no tenderness.  Abdominal: Soft. Bowel sounds are normal. She exhibits no distension and no mass. There is tenderness.  Mild epigastric tenderness   Musculoskeletal: Normal range of motion. She exhibits no edema.  Lymphadenopathy:    She has no cervical adenopathy.  Neurological: She is alert and oriented to person, place, and time. No cranial nerve  deficit. She exhibits normal muscle tone. Coordination normal.  Skin: Skin is warm and dry. No rash noted.  Psychiatric: She has a normal mood and affect. Her behavior is normal. Judgment and thought content normal.  Nursing note and vitals reviewed.  ED Treatments / Results  DIAGNOSTIC STUDIES:  Oxygen Saturation is 100% on RA, normal by my interpretation.    COORDINATION OF CARE:  12:09 AM Will order reticulocytes, folate, iron and TIBC, and hemoglobin and hematocrit testing. Discussed treatment plan which includes GI cocktail with pt at bedside and pt agreed to plan.   Labs (all labs ordered are listed, but only abnormal results are displayed) Labs Reviewed  COMPREHENSIVE METABOLIC PANEL - Abnormal; Notable for the following:       Result Value   CO2 21 (*)    ALT 12 (*)    All other components within normal limits  CBC - Abnormal; Notable for the following:    Hemoglobin 9.5 (*)    HCT 31.3 (*)    MCV 69.4 (*)    MCH 21.1 (*)    RDW 19.0 (*)    All other components within normal limits  HEMOGLOBIN AND HEMATOCRIT, BLOOD - Abnormal; Notable for the following:    Hemoglobin 9.8 (*)    HCT 32.4 (*)    All other components within normal limits  LIPASE, BLOOD  RETICULOCYTES  URINALYSIS, ROUTINE W REFLEX MICROSCOPIC (NOT AT [facility])  VITAMIN B12  FOLATE  IRON AND TIBC  FERRITIN  I-STAT BETA HCG BLOOD, ED ([facility], [facility], [facility] ONLY)    Procedures Procedures (including critical care time)  Medications Ordered in ED Medications  gi cocktail (Maalox,Lidocaine,Donnatal) (30 mLs Oral Given 03/05/16 0023)    Initial Impression / Assessment and Plan / ED Course  I have reviewed the triage vital signs and the nursing notes.  Pertinent labs & imaging results that were available during my care of the patient were reviewed by me and considered in my medical decision making (see chart for details).  Clinical Course   Epigastric pain with episode of vomiting. Patient thinks she vomited  blood, but vital signs are normal and there has been no additional emesis. Etiology could be gastritis, ulcer, GERD. Labs showed normal BUN and creatinine and stable hemoglobin. She's given a GI cocktail with significant improvement. Repeat hemoglobin shows no evidence of drop. She does have significant baseline microcytic anemia. Presence of pagophagia strongly suggests iron deficiency. You panel is obtained and she is discharged with prescription for ferrous sulfate. She is otherwise taking over-the-counter omeprazole. Follow-up with PCP. Advised to discontinue NSAIDs as this is the probable cause for her epigastric pain.  Final Clinical Impressions(s) / ED Diagnoses   Final diagnoses:  Epigastric pain  Non-intractable  vomiting with nausea, vomiting of unspecified type  Microcytic anemia  I personally performed the services described in this documentation, which was scribed in my presence. The recorded information has been reviewed and is accurate.      New Prescriptions New Prescriptions   No medications on file     Dione Booze, MD 03/05/16 0120

## 2016-03-04 NOTE — ED Triage Notes (Signed)
PT states got nauseated this am and started having upper abdominal pain and then felt heartburn.  Then vomited clots of blood.

## 2016-03-05 LAB — URINALYSIS, ROUTINE W REFLEX MICROSCOPIC
Bilirubin Urine: NEGATIVE
Glucose, UA: NEGATIVE mg/dL
Ketones, ur: NEGATIVE mg/dL
Nitrite: NEGATIVE
PH: 5.5 (ref 5.0–8.0)
Protein, ur: NEGATIVE mg/dL
SPECIFIC GRAVITY, URINE: 1.027 (ref 1.005–1.030)

## 2016-03-05 LAB — URINE MICROSCOPIC-ADD ON

## 2016-03-05 LAB — IRON AND TIBC
IRON: 17 ug/dL — AB (ref 28–170)
Saturation Ratios: 4 % — ABNORMAL LOW (ref 10.4–31.8)
TIBC: 399 ug/dL (ref 250–450)
UIBC: 382 ug/dL

## 2016-03-05 LAB — RETICULOCYTES
RBC.: 4.64 MIL/uL (ref 3.87–5.11)
RETIC COUNT ABSOLUTE: 46.4 10*3/uL (ref 19.0–186.0)
Retic Ct Pct: 1 % (ref 0.4–3.1)

## 2016-03-05 LAB — FERRITIN: FERRITIN: 4 ng/mL — AB (ref 11–307)

## 2016-03-05 LAB — HEMOGLOBIN AND HEMATOCRIT, BLOOD
HEMATOCRIT: 32.4 % — AB (ref 36.0–46.0)
HEMOGLOBIN: 9.8 g/dL — AB (ref 12.0–15.0)

## 2016-03-05 LAB — VITAMIN B12: VITAMIN B 12: 189 pg/mL (ref 180–914)

## 2016-03-05 LAB — FOLATE: Folate: 10.5 ng/mL (ref >5.9)

## 2016-03-05 MED ORDER — GI COCKTAIL ~~LOC~~
30.0000 mL | Freq: Once | ORAL | Status: AC
  Administered 2016-03-05: 30 mL via ORAL
  Filled 2016-03-05: qty 30

## 2016-03-05 MED ORDER — FERROUS SULFATE 325 (65 FE) MG PO TABS: 325.0000 mg | ORAL_TABLET | Freq: Every day | ORAL | 0 refills | Status: DC

## 2016-03-05 NOTE — Discharge Instructions (Signed)
Take omeprazole (Prilosec OTC) once a day. Do not take ibuprofen - if you need something for pain, take acetaminophen (Tylenol).

## 2016-09-16 ENCOUNTER — Ambulatory Visit (HOSPITAL_COMMUNITY)
Admission: EM | Admit: 2016-09-16 | Discharge: 2016-09-16 | Disposition: A | Discharge status: Home / Self Care | Payer: Medicaid Other | Attending: Internal Medicine | Admitting: Internal Medicine

## 2016-09-16 ENCOUNTER — Encounter (HOSPITAL_COMMUNITY): Payer: Self-pay | Admitting: Emergency Medicine

## 2016-09-16 DIAGNOSIS — R112 Nausea with vomiting, unspecified: Secondary | ICD-10-CM

## 2016-09-16 DIAGNOSIS — G43909 Migraine, unspecified, not intractable, without status migrainosus: Secondary | ICD-10-CM

## 2016-09-16 MED ORDER — ONDANSETRON 4 MG PO TBDP
4.0000 mg | ORAL_TABLET | Freq: Once | ORAL | Status: AC
  Administered 2016-09-16: 4 mg via ORAL

## 2016-09-16 MED ORDER — KETOROLAC TROMETHAMINE 60 MG/2ML IM SOLN
60.0000 mg | Freq: Once | INTRAMUSCULAR | Status: AC
  Administered 2016-09-16: 60 mg via INTRAMUSCULAR

## 2016-09-16 MED ORDER — ONDANSETRON HCL 4 MG PO TABS: 4.0000 mg | ORAL_TABLET | Freq: Four times a day (QID) | ORAL | 0 refills | Status: DC

## 2016-09-16 MED ORDER — ONDANSETRON 4 MG PO TBDP
ORAL_TABLET | ORAL | Status: AC
  Filled 2016-09-16: qty 1

## 2016-09-16 MED ORDER — DEXAMETHASONE SODIUM PHOSPHATE 10 MG/ML IJ SOLN
INTRAMUSCULAR | Status: AC
  Filled 2016-09-16: qty 1

## 2016-09-16 MED ORDER — DEXAMETHASONE SODIUM PHOSPHATE 10 MG/ML IJ SOLN
10.0000 mg | Freq: Once | INTRAMUSCULAR | Status: AC
  Administered 2016-09-16: 10 mg via INTRAMUSCULAR

## 2016-09-16 MED ORDER — DIPHENHYDRAMINE HCL 50 MG/ML IJ SOLN
INTRAMUSCULAR | Status: AC
  Filled 2016-09-16: qty 1

## 2016-09-16 MED ORDER — DIPHENHYDRAMINE HCL 50 MG/ML IJ SOLN
50.0000 mg | Freq: Once | INTRAMUSCULAR | Status: AC
  Administered 2016-09-16: 50 mg via INTRAMUSCULAR

## 2016-09-16 MED ORDER — KETOROLAC TROMETHAMINE 60 MG/2ML IM SOLN
INTRAMUSCULAR | Status: AC
  Filled 2016-09-16: qty 2

## 2016-09-16 NOTE — Discharge Instructions (Signed)
After receiving her medications today go home late down and rest. Follow-up your primary care doctor for evaluation of headaches and perhaps obtaining medicine to abort the headaches or even prevent him. He needs to see a neurologist.

## 2016-09-16 NOTE — ED Triage Notes (Signed)
Here for constant right sided HA since yest am associated w/nausea, vomiting  A&O x4... NAD

## 2016-09-16 NOTE — ED Provider Notes (Signed)
CSN: [id number]     Arrival date & time 09/16/16  1518 History   First MD Initiated Contact with Patient 09/16/16 1631     Chief Complaint  Patient presents with  . Headache   (Consider location/radiation/quality/duration/timing/severity/associated sxs/prior Treatment) 36 year old obese female with a history of migraine headaches presents to the urgent care complaining of a headache primarily in the right frontal and right temporal area. The headache started yesterday. She has had nausea and one episode of vomiting. She also has photophobia. Denies problems vision, speech, hearing, swallowing, focal weakness. She did have paresthesias of the right upper extremity yesterday but not today. Denies problems with memory, orientation, alertness. She states that this headache is similar to past migraine headaches. Usually she takes and OTC meds such as Excedrin migraine headache which helps the pain and eventually abates. She tried that this time but it did not work. She has never seen an neurologist.      Past Medical History:  Diagnosis Date  . Anemia   . Chronic kidney disease 2008   kidney stones  . Headache   . Infection    UTI's in pregnancy  . Vaginal Pap smear, abnormal    Past Surgical History:  Procedure Laterality Date  . CESAREAN SECTION     x's 3.   . CHOLECYSTECTOMY    . CYST REMOVAL HAND     History reviewed. No pertinent family history. Social History  Substance Use Topics  . Smoking status: Current Some Day Smoker    Types: Cigarettes  . Smokeless tobacco: Never Used  . Alcohol use No   OB History    Gravida Para Term Preterm AB Living   4       1 3    SAB TAB Ectopic Multiple Live Births     1           Review of Systems  Constitutional: Negative for chills, diaphoresis and fever.  HENT: Negative for ear pain, hearing loss, nosebleeds, sore throat and tinnitus.   Eyes:       See also HPI  Respiratory: Negative for cough and shortness of breath.    Cardiovascular: Negative for chest pain, palpitations and leg swelling.  Gastrointestinal: Negative for abdominal pain and blood in stool.  Genitourinary: Negative.   Musculoskeletal: Negative.   Skin: Negative for rash.  Neurological: Positive for headaches. Negative for tremors, syncope, facial asymmetry, speech difficulty and light-headedness.       See also HPI  Psychiatric/Behavioral: Negative.     Allergies  Patient has no known allergies.  Home Medications   Prior to Admission medications   Medication Sig Start Date End Date Taking? Authorizing Provider  ferrous sulfate 325 (65 FE) MG tablet Take 1 tablet (325 mg total) by mouth daily. 03/05/16   Dione Booze, MD   Meds Ordered and Administered this Visit   Medications  ketorolac (TORADOL) injection 60 mg (not administered)  dexamethasone (DECADRON) injection 10 mg (not administered)  diphenhydrAMINE (BENADRYL) injection 50 mg (not administered)  ondansetron (ZOFRAN-ODT) disintegrating tablet 4 mg (not administered)    BP 130/71 (BP Location: Left Arm)   Pulse 94   Temp 98.6 F (37 C) (Oral)   Resp 14   LMP 09/09/2016   SpO2 100%  No data found.   Physical Exam  Constitutional: She is oriented to person, place, and time. She appears well-developed and well-nourished. No distress.  HENT:  Head: Normocephalic and atraumatic.  Right Ear: External ear normal.  Left  Ear: External ear normal.  Mouth/Throat: Oropharynx is clear and moist. No oropharyngeal exudate.  Eyes: Conjunctivae and EOM are normal. Pupils are equal, round, and reactive to light. Right eye exhibits no discharge. Left eye exhibits no discharge.  Neck: Normal range of motion. Neck supple.  Cardiovascular: Normal rate, regular rhythm, normal heart sounds and intact distal pulses.   Pulmonary/Chest: Effort normal and breath sounds normal. No respiratory distress.  Abdominal: Soft. There is no tenderness.  Musculoskeletal: Normal range of motion. She  exhibits no edema or tenderness.  Lymphadenopathy:    She has no cervical adenopathy.  Neurological: She is alert and oriented to person, place, and time. She has normal strength. She displays no tremor. No cranial nerve deficit or sensory deficit. She exhibits normal muscle tone. Coordination and gait normal. GCS eye subscore is 4. GCS verbal subscore is 5. GCS motor subscore is 6.  Skin: Skin is warm and dry. No rash noted.  Psychiatric: She has a normal mood and affect. Her behavior is normal. Judgment and thought content normal.  Nursing note and vitals reviewed.   Urgent Care Course     Procedures (including critical care time)  Labs Review Labs Reviewed - No data to display  Imaging Review No results found.   Visual Acuity Review  Right Eye Distance:   Left Eye Distance:   Bilateral Distance:    Right Eye Near:   Left Eye Near:    Bilateral Near:         MDM   1. Migraine without status migrainosus, not intractable, unspecified migraine type    After receiving her medications today go home late down and rest. Follow-up your primary care doctor for evaluation of headaches and perhaps obtaining medicine to abort the headaches or even prevent him. He needs to see a neurologist. Meds ordered this encounter  Medications  . ketorolac (TORADOL) injection 60 mg  . dexamethasone (DECADRON) injection 10 mg  . diphenhydrAMINE (BENADRYL) injection 50 mg  . ondansetron (ZOFRAN-ODT) disintegrating tablet 4 mg  zofran 4 mg #12 Rx    Hayden Rasmussenavid Montae Stager, NP 09/16/16 1649

## 2017-08-17 ENCOUNTER — Other Ambulatory Visit: Payer: Self-pay | Admitting: Internal Medicine

## 2017-08-17 DIAGNOSIS — R102 Pelvic and perineal pain: Secondary | ICD-10-CM

## 2017-09-13 ENCOUNTER — Ambulatory Visit
Admission: RE | Admit: 2017-09-13 | Discharge: 2017-09-13 | Disposition: A | Discharge status: Home / Self Care | Payer: Self-pay | Source: Ambulatory Visit | Attending: Internal Medicine | Admitting: Internal Medicine

## 2017-09-13 DIAGNOSIS — R102 Pelvic and perineal pain: Secondary | ICD-10-CM

## 2017-12-17 ENCOUNTER — Emergency Department (HOSPITAL_BASED_OUTPATIENT_CLINIC_OR_DEPARTMENT_OTHER): Payer: Medicaid Other

## 2017-12-17 ENCOUNTER — Other Ambulatory Visit: Payer: Self-pay

## 2017-12-17 ENCOUNTER — Encounter (HOSPITAL_COMMUNITY): Payer: Self-pay | Admitting: Emergency Medicine

## 2017-12-17 ENCOUNTER — Emergency Department (HOSPITAL_COMMUNITY)
Admission: EM | Admit: 2017-12-17 | Discharge: 2017-12-17 | Disposition: A | Discharge status: Home / Self Care | Payer: Medicaid Other | Attending: Emergency Medicine | Admitting: Emergency Medicine

## 2017-12-17 DIAGNOSIS — M7989 Other specified soft tissue disorders: Secondary | ICD-10-CM | POA: Diagnosis not present

## 2017-12-17 DIAGNOSIS — R2241 Localized swelling, mass and lump, right lower limb: Secondary | ICD-10-CM | POA: Insufficient documentation

## 2017-12-17 DIAGNOSIS — R030 Elevated blood-pressure reading, without diagnosis of hypertension: Secondary | ICD-10-CM | POA: Insufficient documentation

## 2017-12-17 DIAGNOSIS — R600 Localized edema: Secondary | ICD-10-CM

## 2017-12-17 DIAGNOSIS — F17210 Nicotine dependence, cigarettes, uncomplicated: Secondary | ICD-10-CM | POA: Diagnosis not present

## 2017-12-17 DIAGNOSIS — R2242 Localized swelling, mass and lump, left lower limb: Secondary | ICD-10-CM | POA: Diagnosis not present

## 2017-12-17 LAB — BASIC METABOLIC PANEL
Anion gap: 7 (ref 5–15)
BUN: 13 mg/dL (ref 6–20)
CO2: 23 mmol/L (ref 22–32)
CREATININE: 0.84 mg/dL (ref 0.44–1.00)
Calcium: 8.6 mg/dL — ABNORMAL LOW (ref 8.9–10.3)
Chloride: 110 mmol/L (ref 98–111)
GFR calc Af Amer: >60 mL/min (ref >60)
Glucose, Bld: 115 mg/dL — ABNORMAL HIGH (ref 70–99)
Potassium: 3.4 mmol/L — ABNORMAL LOW (ref 3.5–5.1)
Sodium: 140 mmol/L (ref 135–145)

## 2017-12-17 LAB — CBC
HCT: 30 % — ABNORMAL LOW (ref 36.0–46.0)
Hemoglobin: 8.8 g/dL — ABNORMAL LOW (ref 12.0–15.0)
MCH: 20.4 pg — AB (ref 26.0–34.0)
MCHC: 29.3 g/dL — AB (ref 30.0–36.0)
MCV: 69.6 fL — ABNORMAL LOW (ref 78.0–100.0)
PLATELETS: 406 10*3/uL — AB (ref 150–400)
RBC: 4.31 MIL/uL (ref 3.87–5.11)
RDW: 18.6 % — AB (ref 11.5–15.5)
WBC: 5.7 10*3/uL (ref 4.0–10.5)

## 2017-12-17 NOTE — ED Notes (Addendum)
(  2) Ace wraps applied to knee. Pt. Stated that the wraps felt more supportive for her and helped her discomfort.

## 2017-12-17 NOTE — ED Notes (Signed)
Patient transported to Ultrasound 

## 2017-12-17 NOTE — Progress Notes (Signed)
VASCULAR LAB PRELIMINARY  PRELIMINARY  PRELIMINARY  PRELIMINARY  Bilateral lower extremity venous duplex completed.    Preliminary report:  There is no DVT or SVT noted in the bilateral lower extremities.   Called results to Chari Manninglyssa, PA-C  Ubah Radke, RVT 12/17/2017, 7:07 PM

## 2017-12-17 NOTE — ED Notes (Signed)
Attempted XL knee sleeve, cuts off circulation and causes too much pain on the back of lower thigh.

## 2017-12-17 NOTE — ED Triage Notes (Signed)
Patient to ED c/o bilateral foot swelling with pain onset last night - both feet equal in size, not red or hot to touch. Patient reports going to urgent care for R knee swelling and pain a few days ago and started prednisone. Reports she's been driving a lot the past few days as well. No injuries. Ambulatory with steady gait.

## 2017-12-17 NOTE — ED Provider Notes (Signed)
[facility] EMERGENCY DEPARTMENT Provider Note   CSN: [id number] Arrival date & time: 12/17/17  1625     History   Chief Complaint Chief Complaint  Patient presents with  . Foot Swelling    HPI Sarah Barr is a 37 y.o. female.  HPI  Patient is a 37 year old female with a history of anemia, headaches, presenting for bilateral pedal edema.  Patient reports that he presented to her primary care provider 3 days ago for anterior knee pain, and was prescribed prednisone and hydrocodone.  Patient reports that she is on the third day of taking prednisone, and noted today that she had bilateral lower extremity edema, focused on the ankles and feet, as well as pain to bilateral feet.  Patient reports that she noticed some right calf pain today, but no significant calf pain bilaterally.  Patient denies any erythema bilateral lower extremity's.  No fever or chills.  No estrogen use, recent immobilization, hospitalization, surgeries, cancer treatment, or hypercoagulable risk factors.  No abdominal or pelvic pain.  Past Medical History:  Diagnosis Date  . Anemia   . Chronic kidney disease 2008   kidney stones  . Headache   . Infection    UTI's in pregnancy  . Vaginal Pap smear, abnormal     There are no active problems to display for this patient.   Past Surgical History:  Procedure Laterality Date  . CESAREAN SECTION     x's 3.   . CHOLECYSTECTOMY    . CYST REMOVAL HAND       OB History    Gravida  4   Para      Term      Preterm      AB  1   Living  3     SAB      TAB  1   Ectopic      Multiple      Live Births               Home Medications    Prior to Admission medications   Medication Sig Start Date End Date Taking? Authorizing Provider  HYDROcodone-acetaminophen (NORCO/VICODIN) 5-325 MG tablet Take 1 tablet by mouth every 6 (six) hours as needed for moderate pain.   Yes [provider]    Family History No  family history on file.  Social History Social History   Tobacco Use  . Smoking status: Current Some Day Smoker    Types: Cigarettes  . Smokeless tobacco: Never Used  Substance Use Topics  . Alcohol use: No  . Drug use: Yes    Types: Marijuana     Allergies   Patient has no known allergies.   Review of Systems Review of Systems  HENT: Negative for congestion and rhinorrhea.   Respiratory: Negative for shortness of breath.   Cardiovascular: Positive for leg swelling. Negative for chest pain and palpitations.       +Symmetric leg swelling b/l  Gastrointestinal: Negative for abdominal distention, abdominal pain, nausea and vomiting.  Musculoskeletal: Negative for arthralgias, back pain and joint swelling.  Skin: Negative for color change and wound.  Neurological: Negative for weakness and numbness.  All other systems reviewed and are negative.    Physical Exam Updated Vital Signs BP (!) 143/82   Pulse 91   Temp 98.5 F (36.9 C) (Oral)   Resp 18   Ht 5\' 7"  (1.702 m)   Wt 133.4 kg (294 lb)   LMP 12/16/2017 (Exact Date)  SpO2 100%   BMI 46.05 kg/m   Physical Exam  Constitutional: She appears well-developed and well-nourished. No distress.  HENT:  Head: Normocephalic and atraumatic.  Mouth/Throat: Oropharynx is clear and moist.  Eyes: Pupils are equal, round, and reactive to light. Conjunctivae and EOM are normal.  Neck: Normal range of motion. Neck supple.  Cardiovascular: Normal rate, regular rhythm, S1 normal, S2 normal and intact distal pulses.  No murmur heard. Pulmonary/Chest: Effort normal and breath sounds normal. She has no wheezes. She has no rales.  Abdominal: Soft. She exhibits no distension. There is no tenderness. There is no guarding.  Musculoskeletal: Normal range of motion. She exhibits edema. She exhibits no deformity.  Patient exhibits pedal edema, symmetric bilaterally with soft compartments of bilateral feet and 2+ capillary refill.  DP  pulses are 2+ bilaterally.  There is symmetric, nonpitting edema surrounding bilateral medial and lateral malleolus.   Lymphadenopathy:    She has no cervical adenopathy.  Neurological: She is alert.  Cranial nerves grossly intact. Patient moves extremities symmetrically and with good coordination.  Skin: Skin is warm and dry. No erythema.  Psychiatric: She has a normal mood and affect. Her behavior is normal. Judgment and thought content normal.  Nursing note and vitals reviewed.    ED Treatments / Results  Labs (all labs ordered are listed, but only abnormal results are displayed) Labs Reviewed  CBC - Abnormal; Notable for the following components:      Result Value   Hemoglobin 8.8 (*)    HCT 30.0 (*)    MCV 69.6 (*)    MCH 20.4 (*)    MCHC 29.3 (*)    RDW 18.6 (*)    Platelets 406 (*)    All other components within normal limits  BASIC METABOLIC PANEL - Abnormal; Notable for the following components:   Potassium 3.4 (*)    Glucose, Bld 115 (*)    Calcium 8.6 (*)    All other components within normal limits    EKG None  Radiology No results found.  Procedures Procedures (including critical care time)  Medications Ordered in ED Medications - No data to display   Initial Impression / Assessment and Plan / ED Course  I have reviewed the triage vital signs and the nursing notes.  Pertinent labs & imaging results that were available during my care of the patient were reviewed by me and considered in my medical decision making (see chart for details).     Patient nontoxic-appearing, afebrile, and in no acute distress.  Patient with bilateral nonpitting pedal edema.  Patient is not on calcium channel blockers.  Differential diagnosis includes DVT, iatrogenic from fluid retention due to steroids.  No indication of infectious etiology.  Patient has no major risk factors for DVT, and presentation of bilateral and symmetric pedal edema is not consistent with DVT. Suspect  that patient has fluid retention secondary to recent steroid use and her symptoms began after starting steroids.  Bilateral ultrasounds were performed, and demonstrate no evidence of DVT, and the result was called to me by the technician.  Patient was given return precautions for any increasing swelling, pain, or erythema of the lower extremity's.  Patient is understanding and agrees with the plan of care.  Final Clinical Impressions(s) / ED Diagnoses   Final diagnoses:  Pedal edema  Elevated blood pressure reading without diagnosis of hypertension    ED Discharge Orders    None       Aviva Kluver B,  PA-C 12/17/17 [id number]    Bethann BerkshireZammit, Joseph, MD 12/18/17 [phone]

## 2017-12-17 NOTE — Discharge Instructions (Addendum)
Please see the information and instructions below regarding your visit.  Your diagnoses today include:  1. Pedal edema   2. Elevated blood pressure reading without diagnosis of hypertension     Tests performed today include: See side panel of your discharge paperwork for testing performed today. Vital signs are listed at the bottom of these instructions.   Medications prescribed:    Take any prescribed medications only as prescribed, and any over the counter medications only as directed on the packaging.  I would stop taking the prednisone.  Edema in the lower extremity is a side effect of this medication, and if it is not helping I would stop it.  Home care instructions:  Please follow any educational materials contained in this packet.   Please elevate your feet to help reduce the swelling and promote the fluid to return to the heart.   Follow-up instructions: Please follow-up with your primary care provider in 5 days for further evaluation of your symptoms if they are not completely improved.    Return instructions:  Please return to the Emergency Department if you experience worsening symptoms.  Please return for any increasing pain or swelling, chest pain, shortness of breath, or redness in your feet. Please return if you have any other emergent concerns.  Additional Information:   Your vital signs today were: BP (!) 143/82    Pulse 91    Temp 98.5 F (36.9 C) (Oral)    Resp 18    Ht 5\' 7"  (1.702 m)    Wt 133.4 kg (294 lb)    LMP 12/16/2017 (Exact Date)    SpO2 100%    BMI 46.05 kg/m  If your blood pressure (BP) was elevated on multiple readings during this visit above 130 for the top number or above 80 for the bottom number, please have this repeated by your primary care provider within one month. --------------  Thank you for allowing us to participate in your care today.

## 2019-01-28 ENCOUNTER — Emergency Department (HOSPITAL_COMMUNITY)
Admission: EM | Admit: 2019-01-28 | Discharge: 2019-01-28 | Disposition: A | Discharge status: Home / Self Care | Payer: Medicaid Other | Attending: Emergency Medicine | Admitting: Emergency Medicine

## 2019-01-28 ENCOUNTER — Encounter (HOSPITAL_COMMUNITY): Payer: Self-pay | Admitting: *Deleted

## 2019-01-28 ENCOUNTER — Other Ambulatory Visit: Payer: Self-pay

## 2019-01-28 DIAGNOSIS — R197 Diarrhea, unspecified: Secondary | ICD-10-CM | POA: Diagnosis not present

## 2019-01-28 DIAGNOSIS — Z20828 Contact with and (suspected) exposure to other viral communicable diseases: Secondary | ICD-10-CM | POA: Diagnosis not present

## 2019-01-28 DIAGNOSIS — B349 Viral infection, unspecified: Secondary | ICD-10-CM | POA: Insufficient documentation

## 2019-01-28 DIAGNOSIS — R51 Headache: Secondary | ICD-10-CM | POA: Diagnosis present

## 2019-01-28 DIAGNOSIS — G44209 Tension-type headache, unspecified, not intractable: Secondary | ICD-10-CM

## 2019-01-28 DIAGNOSIS — Z720 Tobacco use: Secondary | ICD-10-CM | POA: Insufficient documentation

## 2019-01-28 LAB — CBC WITH DIFFERENTIAL/PLATELET
Abs Immature Granulocytes: 0.01 10*3/uL (ref 0.00–0.07)
Basophils Absolute: 0 10*3/uL (ref 0.0–0.1)
Basophils Relative: 0 %
Eosinophils Absolute: 0 10*3/uL (ref 0.0–0.5)
Eosinophils Relative: 1 %
HCT: 32.5 % — ABNORMAL LOW (ref 36.0–46.0)
Hemoglobin: 9.3 g/dL — ABNORMAL LOW (ref 12.0–15.0)
Immature Granulocytes: 0 %
Lymphocytes Relative: 26 %
Lymphs Abs: 1.7 10*3/uL (ref 0.7–4.0)
MCH: 19.5 pg — ABNORMAL LOW (ref 26.0–34.0)
MCHC: 28.6 g/dL — ABNORMAL LOW (ref 30.0–36.0)
MCV: 68.3 fL — ABNORMAL LOW (ref 80.0–100.0)
Monocytes Absolute: 0.4 10*3/uL (ref 0.1–1.0)
Monocytes Relative: 7 %
Neutro Abs: 4.3 10*3/uL (ref 1.7–7.7)
Neutrophils Relative %: 66 %
Platelets: 397 10*3/uL (ref 150–400)
RBC: 4.76 MIL/uL (ref 3.87–5.11)
RDW: 18.5 % — ABNORMAL HIGH (ref 11.5–15.5)
WBC: 6.5 10*3/uL (ref 4.0–10.5)
nRBC: 0 % (ref 0.0–0.2)

## 2019-01-28 LAB — COMPREHENSIVE METABOLIC PANEL
ALT: 13 U/L (ref 0–44)
AST: 18 U/L (ref 15–41)
Albumin: 3.3 g/dL — ABNORMAL LOW (ref 3.5–5.0)
Alkaline Phosphatase: 67 U/L (ref 38–126)
Anion gap: 8 (ref 5–15)
BUN: 9 mg/dL (ref 6–20)
CO2: 22 mmol/L (ref 22–32)
Calcium: 8.6 mg/dL — ABNORMAL LOW (ref 8.9–10.3)
Chloride: 106 mmol/L (ref 98–111)
Creatinine, Ser: 0.68 mg/dL (ref 0.44–1.00)
GFR calc Af Amer: >60 mL/min (ref >60)
GFR calc non Af Amer: >60 mL/min (ref >60)
Glucose, Bld: 115 mg/dL — ABNORMAL HIGH (ref 70–99)
Potassium: 3.8 mmol/L (ref 3.5–5.1)
Sodium: 136 mmol/L (ref 135–145)
Total Bilirubin: 0.3 mg/dL (ref 0.3–1.2)
Total Protein: 7.2 g/dL (ref 6.5–8.1)

## 2019-01-28 LAB — LIPASE, BLOOD: Lipase: 21 U/L (ref 11–51)

## 2019-01-28 LAB — I-STAT BETA HCG BLOOD, ED (MC, WL, AP ONLY): I-stat hCG, quantitative: <5 m[IU]/mL (ref <5)

## 2019-01-28 MED ORDER — PROCHLORPERAZINE EDISYLATE 10 MG/2ML IJ SOLN
5.0000 mg | Freq: Once | INTRAMUSCULAR | Status: AC
  Administered 2019-01-28: 13:00:00 5 mg via INTRAVENOUS
  Filled 2019-01-28: qty 2

## 2019-01-28 MED ORDER — KETOROLAC TROMETHAMINE 15 MG/ML IJ SOLN
15.0000 mg | Freq: Once | INTRAMUSCULAR | Status: AC
  Administered 2019-01-28: 15 mg via INTRAVENOUS
  Filled 2019-01-28: qty 1

## 2019-01-28 MED ORDER — SODIUM CHLORIDE 0.9 % IV BOLUS
1000.0000 mL | Freq: Once | INTRAVENOUS | Status: AC
  Administered 2019-01-28: 1000 mL via INTRAVENOUS

## 2019-01-28 MED ORDER — DIPHENHYDRAMINE HCL 50 MG/ML IJ SOLN
12.5000 mg | Freq: Once | INTRAMUSCULAR | Status: AC
Start: 2019-01-28 — End: 2019-01-28
  Administered 2019-01-28: 12.5 mg via INTRAVENOUS
  Filled 2019-01-28: qty 1

## 2019-01-28 NOTE — ED Notes (Signed)
Attempted x2 for IV with no success 

## 2019-01-28 NOTE — ED Triage Notes (Signed)
Patient complains of migraine headache with diarrhea x 3 days. States that in past has had shot for same with relief. Denies trauma

## 2019-01-28 NOTE — Discharge Instructions (Addendum)
Recommend Tylenol, Motrin as needed for pain control.  Please follow-up with your primary doctor for recheck next week.  If in the meantime you develop shortness of breath, vomiting, abdominal pain, fever, numbness, weakness, vision changes or other new concerning symptoms recommend returning to the ER for reassessment.  While awaiting test for your coronavirus, please follow isolation precautions as discussed.

## 2019-01-28 NOTE — ED Provider Notes (Signed)
[facility] Provider Note   CSN: [id number] Arrival date & time: 01/28/19  [id number]    History   Chief Complaint Chief Complaint  Patient presents with  . Headache    HPI Sarah Barr is a 38 y.o. female.  Presents emerged from a chief complaint headache and diarrhea.  Reports that she has a history of frequent headaches, states current headache is similar to prior, was not sudden onset, is not worst headache of her life.  Has had no associated numbness, weakness, vision changes, speech changes, balance issues.  States headache has been relatively constant, 7 out of 10 in severity.  Has not taking any medication today for it.  Worse with bright lights.  Improves with rest.  Patient states yesterday she had few episodes of loose stool.  No blood in stool, no recent antibiotic use.  States that she has had some epigastric abdominal discomfort but denies frank abdominal pain.  Denies abdominal pain at this time.  Note  No associated nausea, vomiting, fever, cough, difficulty breathing chest pain.     HPI  Past Medical History:  Diagnosis Date  . Anemia   . Chronic kidney disease 2008   kidney stones  . Headache   . Infection    UTI's in pregnancy  . Vaginal Pap smear, abnormal     There are no active problems to display for this patient.   Past Surgical History:  Procedure Laterality Date  . CESAREAN SECTION     x's 3.   . CHOLECYSTECTOMY    . CYST REMOVAL HAND       OB History    Gravida  4   Para      Term      Preterm      AB  1   Living  3     SAB      TAB  1   Ectopic      Multiple      Live Births               Home Medications    Prior to Admission medications   Medication Sig Start Date End Date Taking? Authorizing Provider  HYDROcodone-acetaminophen (NORCO/VICODIN) 5-325 MG tablet Take 1 tablet by mouth every 6 (six) hours as needed for moderate pain.    [provider]    Family  History No family history on file.  Social History Social History   Tobacco Use  . Smoking status: Current Some Day Smoker    Types: Cigarettes  . Smokeless tobacco: Never Used  Substance Use Topics  . Alcohol use: No  . Drug use: Yes    Types: Marijuana     Allergies   Patient has no known allergies.   Review of Systems Review of Systems  Constitutional: Negative for chills and fever.  HENT: Negative for ear pain and sore throat.   Eyes: Negative for pain and visual disturbance.  Respiratory: Negative for cough and shortness of breath.   Cardiovascular: Negative for chest pain and palpitations.  Gastrointestinal: Positive for abdominal pain and diarrhea. Negative for vomiting.  Genitourinary: Negative for dysuria and hematuria.  Musculoskeletal: Negative for arthralgias and back pain.  Skin: Negative for color change and rash.  Neurological: Positive for headaches. Negative for seizures and syncope.  All other systems reviewed and are negative.    Physical Exam Updated Vital Signs BP 132/85   Pulse 79   Temp 98.6 F (37 C) (Oral)  Resp 20   SpO2 100%   Physical Exam Vitals signs and nursing note reviewed.  Constitutional:      General: She is not in acute distress.    Appearance: She is well-developed.  HENT:     Head: Normocephalic and atraumatic.  Eyes:     General: No visual field deficit.    Conjunctiva/sclera: Conjunctivae normal.  Neck:     Musculoskeletal: Neck supple.  Cardiovascular:     Rate and Rhythm: Normal rate and regular rhythm.     Heart sounds: No murmur.  Pulmonary:     Effort: Pulmonary effort is normal. No respiratory distress.     Breath sounds: Normal breath sounds.  Abdominal:     Palpations: Abdomen is soft.     Tenderness: There is no abdominal tenderness.  Skin:    General: Skin is warm and dry.  Neurological:     Mental Status: She is alert and oriented to person, place, and time.     GCS: GCS eye subscore is 4. GCS  verbal subscore is 5. GCS motor subscore is 6.     Cranial Nerves: No cranial nerve deficit or facial asymmetry.     Comments: 5/5 strength in bilateral upper and lower extremities, sensation to light touch intact in all 4 extremities, normal finger-nose-finger, no pronator drift      ED Treatments / Results  Labs (all labs ordered are listed, but only abnormal results are displayed) Labs Reviewed  CBC WITH DIFFERENTIAL/PLATELET - Abnormal; Notable for the following components:      Result Value   Hemoglobin 9.3 (*)    HCT 32.5 (*)    MCV 68.3 (*)    MCH 19.5 (*)    MCHC 28.6 (*)    RDW 18.5 (*)    All other components within normal limits  COMPREHENSIVE METABOLIC PANEL - Abnormal; Notable for the following components:   Glucose, Bld 115 (*)    Calcium 8.6 (*)    Albumin 3.3 (*)    All other components within normal limits  NOVEL CORONAVIRUS, NAA (HOSPITAL ORDER, SEND-OUT TO REF LAB)  LIPASE, BLOOD  I-STAT BETA HCG BLOOD, ED ([facility], [facility], [facility] ONLY)    EKG None  Radiology No results found.  Procedures Procedures (including critical care time)  Medications Ordered in ED Medications  diphenhydrAMINE (BENADRYL) injection 12.5 mg (12.5 mg Intravenous Given 01/28/19 1302)  prochlorperazine (COMPAZINE) injection 5 mg (5 mg Intravenous Given 01/28/19 1302)  sodium chloride 0.9 % bolus 1,000 mL (0 mLs Intravenous Stopped 01/28/19 1422)  ketorolac (TORADOL) 15 MG/ML injection 15 mg (15 mg Intravenous Given 01/28/19 1302)     Initial Impression / Assessment and Plan / ED Course  I have reviewed the triage vital signs and the nursing notes.  Pertinent labs & imaging results that were available during my care of the patient were reviewed by me and considered in my medical decision making (see chart for details).        38 year old lady who presents to the emergency department with 2 complaints.  Regarding headache, patient reports that she has a history of prior headaches similar to the  headache today.  Not worst headache of her life.  No associated neurologic complaints or deficits on exam.  Suspect tension type headache, given above believe appropriate to defer head imaging at this time.  Regarding diarrhea, patient's electrolyte Is stable, abdomen was soft.  Doubt acute abdominal process.  Suspect patient may have acute viral illness.  Sent for  COVID.  Reviewed return precautions, will discharge home at this time.    After the discussed management above, the patient was determined to be safe for discharge.  The patient was in agreement with this plan and all questions regarding their care were answered.  ED return precautions were discussed and the patient will return to the ED with any significant worsening of condition.   Final Clinical Impressions(s) / ED Diagnoses   Final diagnoses:  Tension headache  Acute viral syndrome  Diarrhea, unspecified type    ED Discharge Orders    None       Milagros Lollykstra, Rhyen Mazariego S, MD 01/28/19 1956

## 2019-01-29 LAB — NOVEL CORONAVIRUS, NAA (HOSP ORDER, SEND-OUT TO REF LAB; TAT 18-24 HRS): SARS-CoV-2, NAA: NOT DETECTED

## 2019-07-30 ENCOUNTER — Other Ambulatory Visit: Payer: Self-pay | Admitting: Physician Assistant

## 2019-07-30 DIAGNOSIS — Z1231 Encounter for screening mammogram for malignant neoplasm of breast: Secondary | ICD-10-CM

## 2019-09-13 ENCOUNTER — Ambulatory Visit: Payer: Medicaid Other

## 2019-10-01 ENCOUNTER — Ambulatory Visit: Payer: Medicaid Other

## 2019-10-03 ENCOUNTER — Ambulatory Visit: Payer: Medicaid Other

## 2019-12-10 ENCOUNTER — Encounter (HOSPITAL_COMMUNITY): Payer: Self-pay

## 2019-12-10 ENCOUNTER — Other Ambulatory Visit: Payer: Self-pay

## 2019-12-10 ENCOUNTER — Emergency Department (HOSPITAL_COMMUNITY): Payer: Medicaid Other

## 2019-12-10 ENCOUNTER — Emergency Department (HOSPITAL_COMMUNITY)
Admission: EM | Admit: 2019-12-10 | Discharge: 2019-12-10 | Disposition: A | Discharge status: Home / Self Care | Payer: Medicaid Other | Attending: Emergency Medicine | Admitting: Emergency Medicine

## 2019-12-10 DIAGNOSIS — F17210 Nicotine dependence, cigarettes, uncomplicated: Secondary | ICD-10-CM | POA: Insufficient documentation

## 2019-12-10 DIAGNOSIS — N201 Calculus of ureter: Secondary | ICD-10-CM

## 2019-12-10 DIAGNOSIS — R110 Nausea: Secondary | ICD-10-CM | POA: Insufficient documentation

## 2019-12-10 DIAGNOSIS — R1032 Left lower quadrant pain: Secondary | ICD-10-CM | POA: Diagnosis present

## 2019-12-10 LAB — I-STAT CHEM 8, ED
BUN: 12 mg/dL (ref 6–20)
Calcium, Ion: 1.2 mmol/L (ref 1.15–1.40)
Chloride: 107 mmol/L (ref 98–111)
Creatinine, Ser: 0.6 mg/dL (ref 0.44–1.00)
Glucose, Bld: 90 mg/dL (ref 70–99)
HCT: 31 % — ABNORMAL LOW (ref 36.0–46.0)
Hemoglobin: 10.5 g/dL — ABNORMAL LOW (ref 12.0–15.0)
Potassium: 3.6 mmol/L (ref 3.5–5.1)
Sodium: 143 mmol/L (ref 135–145)
TCO2: 24 mmol/L (ref 22–32)

## 2019-12-10 LAB — I-STAT BETA HCG BLOOD, ED (MC, WL, AP ONLY): I-stat hCG, quantitative: <5 m[IU]/mL (ref <5)

## 2019-12-10 LAB — URINALYSIS, ROUTINE W REFLEX MICROSCOPIC
Bilirubin Urine: NEGATIVE
Glucose, UA: NEGATIVE mg/dL
Ketones, ur: NEGATIVE mg/dL
Leukocytes,Ua: NEGATIVE
Nitrite: NEGATIVE
Protein, ur: 30 mg/dL — AB
RBC / HPF: >50 RBC/hpf — ABNORMAL HIGH (ref 0–5)
Specific Gravity, Urine: 1.026 (ref 1.005–1.030)
pH: 5 (ref 5.0–8.0)

## 2019-12-10 LAB — POC URINE PREG, ED: Preg Test, Ur: NEGATIVE

## 2019-12-10 MED ORDER — ONDANSETRON HCL 4 MG PO TABS
4.0000 mg | ORAL_TABLET | Freq: Four times a day (QID) | ORAL | 0 refills | Status: DC
Start: 2019-12-10 — End: 2020-01-18

## 2019-12-10 MED ORDER — IBUPROFEN 600 MG PO TABS
600.0000 mg | ORAL_TABLET | Freq: Four times a day (QID) | ORAL | 0 refills | Status: DC | PRN
Start: 2019-12-10 — End: 2021-10-12

## 2019-12-10 MED ORDER — KETOROLAC TROMETHAMINE 30 MG/ML IJ SOLN
30.0000 mg | Freq: Once | INTRAMUSCULAR | Status: AC
  Administered 2019-12-10: 30 mg via INTRAMUSCULAR
  Filled 2019-12-10: qty 1

## 2019-12-10 MED ORDER — HYDROCODONE-ACETAMINOPHEN 5-325 MG PO TABS: 1.0000 | ORAL_TABLET | Freq: Three times a day (TID) | ORAL | 0 refills | Status: DC | PRN

## 2019-12-10 MED ORDER — TAMSULOSIN HCL 0.4 MG PO CAPS
0.4000 mg | ORAL_CAPSULE | Freq: Every day | ORAL | 0 refills | Status: AC
Start: 2019-12-10 — End: 2020-01-09

## 2019-12-10 NOTE — ED Triage Notes (Signed)
Patient diagnosed 2 weeks ago with kidney stones and now having recurrent pain left flank. Reports took antibiotic as prescribed, no dysuria, nausea without emesis

## 2019-12-10 NOTE — Discharge Instructions (Addendum)
You may take 600 mg ibuprofen every 6 hours for pain. If you have persistent pain despite ibuprofen, you may take one tablet of Norco every 8 hours.   When taking Norc, take medication as directed and do not operate machinery, drive a car, or work while taking this medication as it can make you drowsy. This medication may also cause constipation.   You were also given a prescription for Flomax, pain medication, and nausea medication.    You will need to follow-up with urology for reevaluation and for further treatment of your kidney stone.  You will need to return to the emergency department for any fevers, persistent pain, persistent vomiting, inability to urinate, or any new or worsening symptoms.

## 2019-12-10 NOTE — ED Provider Notes (Signed)
[facility] EMERGENCY DEPARTMENT Provider Note   CSN: [id number] Arrival date & time: 12/10/19  1128     History Chief Complaint  Patient presents with  . Flank Pain    Sarah Barr is a 39 y.o. female.  HPI   39 year old female with a history of anemia, nephrolithiasis, headache, UTIs, who presents the emergency department today complaining of left flank pain.  Patient states that pain started 2 weeks ago in the left flank.  Pain is constant in nature and severe.  It radiates to the left side of the abdomen.  It is associated with nausea, urinary frequency and initially some hematuria which has resolved.  She reports an associated nausea and constipation.  Denies diarrhea or fevers.  She is seen by her PCP 2 weeks ago and diagnosed with a possible kidney stone.  She was started on a 10-day course of antibiotics at that time and told to follow-up with a specialist but she did not.  States that symptoms improved for some time however they have since recurred.  States she did not have any imaging done at that time she just had a UA and blood work.  Past Medical History:  Diagnosis Date  . Anemia   . Chronic kidney disease 2008   kidney stones  . Headache   . Infection    UTI's in pregnancy  . Vaginal Pap smear, abnormal     There are no problems to display for this patient.   Past Surgical History:  Procedure Laterality Date  . CESAREAN SECTION     x's 3.   . CHOLECYSTECTOMY    . CYST REMOVAL HAND       OB History    Gravida  4   Para      Term      Preterm      AB  1   Living  3     SAB      TAB  1   Ectopic      Multiple      Live Births              No family history on file.  Social History   Tobacco Use  . Smoking status: Current Some Day Smoker    Types: Cigarettes  . Smokeless tobacco: Never Used  Substance Use Topics  . Alcohol use: No  . Drug use: Yes    Types: Marijuana    Home Medications Prior to  Admission medications   Medication Sig Start Date End Date Taking? Authorizing Provider  HYDROcodone-acetaminophen (NORCO/VICODIN) 5-325 MG tablet Take 1 tablet by mouth every 8 (eight) hours as needed. 12/10/19   Dahna Hattabaugh S, PA-C  ibuprofen (ADVIL) 600 MG tablet Take 1 tablet (600 mg total) by mouth every 6 (six) hours as needed. 12/10/19   Jelesa Mangini S, PA-C  ondansetron (ZOFRAN) 4 MG tablet Take 1 tablet (4 mg total) by mouth every 6 (six) hours. 12/10/19   Nickalous Stingley S, PA-C  tamsulosin (FLOMAX) 0.4 MG CAPS capsule Take 1 capsule (0.4 mg total) by mouth daily. 12/10/19 01/09/20  Nasha Diss S, PA-C    Allergies    Patient has no known allergies.  Review of Systems   Review of Systems  Constitutional: Negative for chills and fever.  HENT: Negative for ear pain and sore throat.   Eyes: Negative for pain and visual disturbance.  Respiratory: Negative for cough and shortness of breath.   Cardiovascular: Negative for chest  pain and palpitations.  Gastrointestinal: Positive for constipation and nausea. Negative for abdominal pain, diarrhea and vomiting.  Genitourinary: Positive for flank pain and frequency. Negative for dysuria and hematuria.  Musculoskeletal: Negative for arthralgias and back pain.  Skin: Negative for color change and rash.  Neurological: Negative for seizures and syncope.  All other systems reviewed and are negative.   Physical Exam Updated Vital Signs BP 128/85 (BP Location: Right Arm)   Pulse 71   Temp 98.1 F (36.7 C) (Oral)   Resp 16   SpO2 100%   Physical Exam Vitals and nursing note reviewed.  Constitutional:      General: She is not in acute distress.    Appearance: She is well-developed.  HENT:     Head: Normocephalic and atraumatic.  Eyes:     Conjunctiva/sclera: Conjunctivae normal.  Cardiovascular:     Rate and Rhythm: Normal rate and regular rhythm.     Heart sounds: No murmur heard.   Pulmonary:     Effort: Pulmonary  effort is normal. No respiratory distress.     Breath sounds: Normal breath sounds.  Abdominal:     General: Bowel sounds are normal.     Palpations: Abdomen is soft.     Tenderness: There is no abdominal tenderness. There is left CVA tenderness. There is no right CVA tenderness, guarding or rebound.  Musculoskeletal:     Cervical back: Neck supple.  Skin:    General: Skin is warm and dry.  Neurological:     Mental Status: She is alert.     ED Results / Procedures / Treatments   Labs (all labs ordered are listed, but only abnormal results are displayed) Labs Reviewed  URINALYSIS, ROUTINE W REFLEX MICROSCOPIC - Abnormal; Notable for the following components:      Result Value   APPearance HAZY (*)    Hgb urine dipstick LARGE (*)    Protein, ur 30 (*)    RBC / HPF >50 (*)    Bacteria, UA MANY (*)    All other components within normal limits  I-STAT CHEM 8, ED - Abnormal; Notable for the following components:   Hemoglobin 10.5 (*)    HCT 31.0 (*)    All other components within normal limits  URINE CULTURE  POC URINE PREG, ED  I-STAT BETA HCG BLOOD, ED ([facility], [facility], [facility] ONLY)    EKG None  Radiology CT Renal Stone Study  Result Date: 12/10/2019 CLINICAL DATA:  Left flank pain. EXAM: CT ABDOMEN AND PELVIS WITHOUT CONTRAST TECHNIQUE: Multidetector CT imaging of the abdomen and pelvis was performed following the standard protocol without IV contrast. COMPARISON:  None. FINDINGS: Lower chest: Mild atelectasis is seen within the left lung base. Hepatobiliary: No focal liver abnormality is seen. Status post cholecystectomy. No biliary dilatation. Pancreas: Unremarkable. No pancreatic ductal dilatation or surrounding inflammatory changes. Spleen: Normal in size without focal abnormality. Adrenals/Urinary Tract: Adrenal glands are unremarkable. Kidneys are normal in size without focal lesions. A 4 mm renal stone is seen within the mid left ureter (axial CT image 60, CT series number 3), with  very mild left-sided hydroureter. A 3 mm nonobstructing renal stone is seen within the right kidney. An additional 4 mm parenchymal calcification is seen within the right kidney. A 2 mm nonobstructing renal stone is seen within the mid left kidney. Bladder is unremarkable. Stomach/Bowel: Stomach is within normal limits. Appendix appears normal. No evidence of bowel wall thickening, distention, or inflammatory changes. Vascular/Lymphatic: No significant vascular  findings are present. No enlarged abdominal or pelvic lymph nodes. Reproductive: Uterus and bilateral adnexa are unremarkable. Other: No abdominal wall hernia or abnormality. No abdominopelvic ascites. Musculoskeletal: No acute or significant osseous findings. IMPRESSION: 1. 4 mm renal stone within the mid left ureter, with very mild left-sided hydroureter. 2. Bilateral nonobstructing renal calculi. 3. Evidence of prior cholecystectomy. Electronically Signed   By: Aram Candela M.D.   On: 12/10/2019 17:44    Procedures Procedures (including critical care time)  Medications Ordered in ED Medications  ketorolac (TORADOL) 30 MG/ML injection 30 mg (30 mg Intramuscular Given 12/10/19 1604)    ED Course  I have reviewed the triage vital signs and the nursing notes.  Pertinent labs & imaging results that were available during my care of the patient were reviewed by me and considered in my medical decision making (see chart for details).    MDM Rules/Calculators/A&P                          39 year old female with history of nephrolithiasis presenting for evaluation of left flank pain that initially started 2 weeks ago.  Treated with a course of antibiotics and diagnosed with suspected kidney stones by PCP however scan has not been completed yet. States pain recurred after stopping abx.  Chem 8 with mild anemia that is chronic UA with hematuria, no leuks, nitrites, or bacteria, culture sent. Urine preg neg  CT renal with 4 mm renal stone  within the mid left ureter, with very mild left-sided hydroureter. Bilateral nonobstructing renal calculi. 3. Evidence of prior cholecystectomy.   Pt well appearing and in no distress after toradol.  We will give Rx for ibuprofen, pain medication, tamsulosin and Zofran.  Have advised follow-up with [facility].  Advised on specific return precautions.  She voices understanding of the plan and reasons to return.  All questions answered.  Patient stable for discharge.  Final Clinical Impression(s) / ED Diagnoses Final diagnoses:  Ureteral stone    Rx / DC Orders ED Discharge Orders         Ordered    ibuprofen (ADVIL) 600 MG tablet  Every 6 hours PRN     Discontinue  Reprint     12/10/19 1810    HYDROcodone-acetaminophen (NORCO/VICODIN) 5-325 MG tablet  Every 8 hours PRN     Discontinue  Reprint     12/10/19 1810    tamsulosin (FLOMAX) 0.4 MG CAPS capsule  Daily     Discontinue  Reprint     12/10/19 1810    ondansetron (ZOFRAN) 4 MG tablet  Every 6 hours     Discontinue  Reprint     12/10/19 1810           Karrie Meres, PA-C 12/10/19 1812    Tegeler, Canary Brim, MD 12/11/19 1140

## 2019-12-10 NOTE — ED Notes (Signed)
Pt discharged from ED; instructions provided; Pt encouraged to return to ED if symptoms worsen and to f/u with PCP; Pt verbalized understanding of all instructions 

## 2019-12-11 LAB — URINE CULTURE: Culture: <10000 — AB

## 2019-12-14 ENCOUNTER — Emergency Department (HOSPITAL_COMMUNITY)
Admission: EM | Admit: 2019-12-14 | Discharge: 2019-12-14 | Disposition: A | Discharge status: Home / Self Care | Payer: Medicaid Other | Attending: Emergency Medicine | Admitting: Emergency Medicine

## 2019-12-14 ENCOUNTER — Encounter (HOSPITAL_COMMUNITY): Payer: Self-pay | Admitting: Emergency Medicine

## 2019-12-14 ENCOUNTER — Other Ambulatory Visit: Payer: Self-pay

## 2019-12-14 DIAGNOSIS — R112 Nausea with vomiting, unspecified: Secondary | ICD-10-CM | POA: Insufficient documentation

## 2019-12-14 DIAGNOSIS — N201 Calculus of ureter: Secondary | ICD-10-CM

## 2019-12-14 DIAGNOSIS — R109 Unspecified abdominal pain: Secondary | ICD-10-CM | POA: Insufficient documentation

## 2019-12-14 DIAGNOSIS — Z79899 Other long term (current) drug therapy: Secondary | ICD-10-CM | POA: Diagnosis not present

## 2019-12-14 DIAGNOSIS — F17210 Nicotine dependence, cigarettes, uncomplicated: Secondary | ICD-10-CM | POA: Insufficient documentation

## 2019-12-14 LAB — URINALYSIS, ROUTINE W REFLEX MICROSCOPIC
Bilirubin Urine: NEGATIVE
Glucose, UA: NEGATIVE mg/dL
Ketones, ur: NEGATIVE mg/dL
Leukocytes,Ua: NEGATIVE
Nitrite: NEGATIVE
Protein, ur: 30 mg/dL — AB
RBC / HPF: >50 RBC/hpf — ABNORMAL HIGH (ref 0–5)
Specific Gravity, Urine: 1.014 (ref 1.005–1.030)
pH: 6 (ref 5.0–8.0)

## 2019-12-14 MED ORDER — ONDANSETRON HCL 4 MG/2ML IJ SOLN
4.0000 mg | Freq: Once | INTRAMUSCULAR | Status: AC
  Administered 2019-12-14: 4 mg via INTRAVENOUS
  Filled 2019-12-14: qty 2

## 2019-12-14 MED ORDER — ONDANSETRON 4 MG PO TBDP
4.0000 mg | ORAL_TABLET | Freq: Once | ORAL | Status: AC
  Administered 2019-12-14: 4 mg via ORAL
  Filled 2019-12-14: qty 1

## 2019-12-14 MED ORDER — OXYCODONE-ACETAMINOPHEN 5-325 MG PO TABS
2.0000 | ORAL_TABLET | Freq: Once | ORAL | Status: AC
  Administered 2019-12-14: 2 via ORAL
  Filled 2019-12-14: qty 2

## 2019-12-14 MED ORDER — SODIUM CHLORIDE 0.9 % IV BOLUS (SEPSIS)
1000.0000 mL | Freq: Once | INTRAVENOUS | Status: AC
  Administered 2019-12-14: 1000 mL via INTRAVENOUS

## 2019-12-14 MED ORDER — KETOROLAC TROMETHAMINE 30 MG/ML IJ SOLN
30.0000 mg | Freq: Once | INTRAMUSCULAR | Status: AC
  Administered 2019-12-14: 30 mg via INTRAVENOUS
  Filled 2019-12-14: qty 1

## 2019-12-14 MED ORDER — HYDROMORPHONE HCL 1 MG/ML IJ SOLN
1.0000 mg | Freq: Once | INTRAMUSCULAR | Status: AC
  Administered 2019-12-14: 1 mg via INTRAVENOUS
  Filled 2019-12-14: qty 1

## 2019-12-14 MED ORDER — OXYCODONE-ACETAMINOPHEN 5-325 MG PO TABS: 2.0000 | ORAL_TABLET | Freq: Four times a day (QID) | ORAL | 0 refills | Status: DC | PRN

## 2019-12-14 NOTE — ED Notes (Signed)
Patient verbalizes understanding of discharge instructions. Opportunity for questioning and answers were provided. Armband removed by staff, pt discharged from ED. Pt. ambulatory and discharged home.  

## 2019-12-14 NOTE — ED Provider Notes (Signed)
TIME SEEN: 3:43 AM  CHIEF COMPLAINT: Left-sided flank pain  HPI: Patient is a 39 year old female with history of kidney stones who presents to the emergency department persistent flank pain, nausea and vomiting.  Was seen in the emergency department 12/10/2019 for the same and had a CT scan that showed a 4 mm renal stone in the mid left ureter with a very mild left-sided hydroureter.  No fevers, diarrhea.  Taking hydrocodone at home without much relief as well as 600 mg of ibuprofen.  ROS: See HPI Constitutional: no fever  Eyes: no drainage  ENT: no runny nose   Cardiovascular:  no chest pain  Resp: no SOB  GI:  vomiting GU: no dysuria Integumentary: no rash  Allergy: no hives  Musculoskeletal: no leg swelling  Neurological: no slurred speech ROS otherwise negative  PAST MEDICAL HISTORY/PAST SURGICAL HISTORY:  Past Medical History:  Diagnosis Date  . Anemia   . Chronic kidney disease 2008   kidney stones  . Headache   . Infection    UTI's in pregnancy  . Vaginal Pap smear, abnormal     MEDICATIONS:  Prior to Admission medications   Medication Sig Start Date End Date Taking? Authorizing Provider  HYDROcodone-acetaminophen (NORCO/VICODIN) 5-325 MG tablet Take 1 tablet by mouth every 8 (eight) hours as needed. 12/10/19   Couture, Cortni S, PA-C  ibuprofen (ADVIL) 600 MG tablet Take 1 tablet (600 mg total) by mouth every 6 (six) hours as needed. 12/10/19   Couture, Cortni S, PA-C  ondansetron (ZOFRAN) 4 MG tablet Take 1 tablet (4 mg total) by mouth every 6 (six) hours. 12/10/19   Couture, Cortni S, PA-C  tamsulosin (FLOMAX) 0.4 MG CAPS capsule Take 1 capsule (0.4 mg total) by mouth daily. 12/10/19 01/09/20  Couture, Cortni S, PA-C    ALLERGIES:  No Known Allergies  SOCIAL HISTORY:  Social History   Tobacco Use  . Smoking status: Current Some Day Smoker    Types: Cigarettes  . Smokeless tobacco: Never Used  Substance Use Topics  . Alcohol use: No    FAMILY HISTORY: No  family history on file.  EXAM: BP (!) 146/95 (BP Location: Left Arm)   Pulse 80   Temp 98.6 F (37 C) (Oral)   Resp 16   SpO2 100%  CONSTITUTIONAL: Alert and oriented and responds appropriately to questions. Well-appearing; well-nourished HEAD: Normocephalic EYES: Conjunctivae clear, pupils appear equal, EOM appear intact ENT: normal nose; moist mucous membranes NECK: Supple, normal ROM CARD: RRR; S1 and S2 appreciated; no murmurs, no clicks, no rubs, no gallops RESP: Normal chest excursion without splinting or tachypnea; breath sounds clear and equal bilaterally; no wheezes, no rhonchi, no rales, no hypoxia or respiratory distress, speaking full sentences ABD/GI: Normal bowel sounds; non-distended; soft, non-tender, no rebound, no guarding, no peritoneal signs, no hepatosplenomegaly BACK:  The back appears normal, patient has minimal left CVA tenderness EXT: Normal ROM in all joints; no deformity noted, no edema; no cyanosis SKIN: Normal color for age and race; warm; no rash on exposed skin NEURO: Moves all extremities equally PSYCH: The patient's mood and manner are appropriate.   MEDICAL DECISION MAKING: Patient here with left-sided flank pain from kidney stone seen on CT imaging on 12/10/2019.  Urine today shows no new sign of infection and she is afebrile, nontoxic.  Will treat symptoms with IV fluids, Toradol, Zofran, Dilaudid.  Anticipate once pain is controlled we will be able to discharge her with oxycodone and outpatient urology follow-up.  She  is well-appearing here, nontoxic and does not appear to be in significant distress.  ED PROGRESS: Patient reports feeling better.  Will discharge with prescription of Percocet.  Advised her not to take hydrocodone and oxycodone at the same time.  Given outpatient urology follow-up.  Advised her to continue ibuprofen, Flomax and Zofran as prescribed.  At this time, I do not feel there is any life-threatening condition present. I have  reviewed, interpreted and discussed all results (EKG, imaging, lab, urine as appropriate) and exam findings with patient/family. I have reviewed nursing notes and appropriate previous records.  I feel the patient is safe to be discharged home without further emergent workup and can continue workup as an outpatient as needed. Discussed usual and customary return precautions. Patient/family verbalize understanding and are comfortable with this plan.  Outpatient follow-up has been provided as needed. All questions have been answered.   Sarah Barr was evaluated in Emergency Department on 12/14/2019 for the symptoms described in the history of present illness. She was evaluated in the context of the global COVID-19 pandemic, which necessitated consideration that the patient might be at risk for infection with the SARS-CoV-2 virus that causes COVID-19. Institutional protocols and algorithms that pertain to the evaluation of patients at risk for COVID-19 are in a state of rapid change based on information released by regulatory bodies including the CDC and federal and state organizations. These policies and algorithms were followed during the patient's care in the ED.      Sarah Barr, [name], DO 12/14/19 [phone]

## 2019-12-14 NOTE — ED Triage Notes (Signed)
Patient with known kidney stone on the left.  Patient continues with left flank pain, nausea and vomiting.  Patient states that she has been taking pain meds and other meds with no relief of left flank pain.

## 2019-12-14 NOTE — Discharge Instructions (Addendum)
Please continue your ibuprofen and Flomax as prescribed.  You may use Zofran as needed for nausea.  We have provided you with a prescription for oxycodone which is stronger than hydrocodone which you may take for pain.  Please do not take both.

## 2020-01-18 ENCOUNTER — Other Ambulatory Visit: Payer: Self-pay | Admitting: Urology

## 2020-01-18 MED ORDER — ONDANSETRON HCL 4 MG PO TABS: 4.0000 mg | ORAL_TABLET | Freq: Four times a day (QID) | ORAL | 0 refills | Status: DC

## 2020-01-18 MED ORDER — OXYCODONE-ACETAMINOPHEN 5-325 MG PO TABS: 2.0000 | ORAL_TABLET | Freq: Four times a day (QID) | ORAL | 0 refills | Status: DC | PRN

## 2020-02-05 ENCOUNTER — Other Ambulatory Visit: Payer: Self-pay | Admitting: Urology

## 2020-02-07 NOTE — Progress Notes (Signed)
Patient to arrive at 0800 on 02/11/2020. History and medications reviewed. All pre-procedure instructions given. NPO after MN. Driver secured.

## 2020-02-08 ENCOUNTER — Other Ambulatory Visit (HOSPITAL_COMMUNITY)
Admission: RE | Admit: 2020-02-08 | Discharge: 2020-02-08 | Disposition: A | Discharge status: Home / Self Care | Payer: Medicaid Other | Source: Ambulatory Visit | Attending: Urology | Admitting: Urology

## 2020-02-08 DIAGNOSIS — Z01812 Encounter for preprocedural laboratory examination: Secondary | ICD-10-CM | POA: Insufficient documentation

## 2020-02-08 DIAGNOSIS — Z20822 Contact with and (suspected) exposure to covid-19: Secondary | ICD-10-CM | POA: Insufficient documentation

## 2020-02-08 LAB — SARS CORONAVIRUS 2 (TAT 6-24 HRS): SARS Coronavirus 2: NEGATIVE

## 2020-02-10 NOTE — H&P (Signed)
H&P  Chief Complaint: Left kidney stone  History of Present Illness: Sarah Barr is a 39 y.o. year old female presenting for left ESL for mgmt of a symptomatic 4 mm left mid ureteral stone.  Past Medical History:  Diagnosis Date  . Anemia   . Chronic kidney disease 2008   kidney stones  . Headache   . Infection    UTI's in pregnancy  . Vaginal Pap smear, abnormal     Past Surgical History:  Procedure Laterality Date  . CESAREAN SECTION     x's 3.   . CHOLECYSTECTOMY    . CYST REMOVAL HAND      Home Medications:  No medications prior to admission.    Allergies: No Known Allergies  No family history on file.  Social History:  reports that she has been smoking cigarettes. She has never used smokeless tobacco. She reports current drug use. Drug: Marijuana. She reports that she does not drink alcohol.  ROS: A complete review of systems was performed.  All systems are negative except for pertinent findings as noted.  Physical Exam:  Vital signs in last 24 hours: BP: ()/()  Arterial Line BP: ()/()  General:  Alert and oriented, No acute distress HEENT: Normocephalic, atraumatic Neck: No JVD or lymphadenopathy Cardiovascular: Regular rate  Lungs: Normal inspiratory/expiratory excursion Abdomen: Soft, nontender, nondistended, no abdominal masses Back: No CVA tenderness Extremities: No edema Neurologic: Grossly intact  Laboratory Data:  No results found for this or any previous visit (from the past 24 hour(s)). Recent Results (from the past 240 hour(s))  SARS CORONAVIRUS 2 (TAT 6-24 HRS) Nasopharyngeal Nasopharyngeal Swab     Status: None   Collection Time: 02/08/20  3:12 PM   Specimen: Nasopharyngeal Swab  Result Value Ref Range Status   SARS Coronavirus 2 NEGATIVE NEGATIVE Final    Comment: (NOTE) SARS-CoV-2 target nucleic acids are NOT DETECTED.  The SARS-CoV-2 RNA is generally detectable in upper and lower respiratory specimens during the acute phase of  infection. Negative results do not preclude SARS-CoV-2 infection, do not rule out co-infections with other pathogens, and should not be used as the sole basis for treatment or other patient management decisions. Negative results must be combined with clinical observations, patient history, and epidemiological information. The expected result is Negative.  Fact Sheet for Patients: HairSlick.no  Fact Sheet for Healthcare Providers: quierodirigir.com  This test is not yet approved or cleared by the [place] FDA and  has been authorized for detection and/or diagnosis of SARS-CoV-2 by FDA under an Emergency Use Authorization (EUA). This EUA will remain  in effect (meaning this test can be used) for the duration of the COVID-19 declaration under Se ction 564(b)(1) of the Act, 21 U.S.C. section 360bbb-3(b)(1), unless the authorization is terminated or revoked sooner.  Performed at [facility], [address], [place], [place] [address]    Creatinine: No results for input(s): CREATININE in the last 168 hours.  Radiologic Imaging: No results found.  Impression/Assessment:  4 mm left mid ureteral stone  Plan:  Left ESL  Bertram Millard Chiquitta Matty 02/10/2020, 7:17 PM  Bertram Millard. Chaia Ikard MD

## 2020-02-11 ENCOUNTER — Other Ambulatory Visit: Payer: Self-pay

## 2020-02-11 ENCOUNTER — Ambulatory Visit (HOSPITAL_COMMUNITY): Payer: Medicaid Other

## 2020-02-11 ENCOUNTER — Ambulatory Visit (HOSPITAL_BASED_OUTPATIENT_CLINIC_OR_DEPARTMENT_OTHER)
Admission: RE | Admit: 2020-02-11 | Discharge: 2020-02-11 | Disposition: A | Discharge status: Home / Self Care | Payer: Medicaid Other | Attending: Urology | Admitting: Urology

## 2020-02-11 ENCOUNTER — Encounter (HOSPITAL_BASED_OUTPATIENT_CLINIC_OR_DEPARTMENT_OTHER): Payer: Self-pay | Admitting: Urology

## 2020-02-11 ENCOUNTER — Encounter (HOSPITAL_BASED_OUTPATIENT_CLINIC_OR_DEPARTMENT_OTHER)
Admission: RE | Disposition: A | Discharge status: Home / Self Care | Payer: Self-pay | Source: Home / Self Care | Attending: Urology

## 2020-02-11 DIAGNOSIS — J45909 Unspecified asthma, uncomplicated: Secondary | ICD-10-CM | POA: Insufficient documentation

## 2020-02-11 DIAGNOSIS — Z87442 Personal history of urinary calculi: Secondary | ICD-10-CM | POA: Insufficient documentation

## 2020-02-11 DIAGNOSIS — F17210 Nicotine dependence, cigarettes, uncomplicated: Secondary | ICD-10-CM | POA: Insufficient documentation

## 2020-02-11 DIAGNOSIS — N189 Chronic kidney disease, unspecified: Secondary | ICD-10-CM | POA: Diagnosis not present

## 2020-02-11 DIAGNOSIS — N201 Calculus of ureter: Secondary | ICD-10-CM | POA: Insufficient documentation

## 2020-02-11 DIAGNOSIS — Z9049 Acquired absence of other specified parts of digestive tract: Secondary | ICD-10-CM | POA: Insufficient documentation

## 2020-02-11 DIAGNOSIS — Z87440 Personal history of urinary (tract) infections: Secondary | ICD-10-CM | POA: Insufficient documentation

## 2020-02-11 DIAGNOSIS — N202 Calculus of kidney with calculus of ureter: Secondary | ICD-10-CM | POA: Diagnosis present

## 2020-02-11 HISTORY — PX: EXTRACORPOREAL SHOCK WAVE LITHOTRIPSY: SHX1557

## 2020-02-11 SURGERY — LITHOTRIPSY, ESWL
Anesthesia: LOCAL | Laterality: Left

## 2020-02-11 MED ORDER — DIAZEPAM 5 MG PO TABS
10.0000 mg | ORAL_TABLET | ORAL | Status: AC
  Administered 2020-02-11: 10 mg via ORAL

## 2020-02-11 MED ORDER — DIPHENHYDRAMINE HCL 25 MG PO CAPS
25.0000 mg | ORAL_CAPSULE | ORAL | Status: AC
  Administered 2020-02-11: 25 mg via ORAL

## 2020-02-11 MED ORDER — DIPHENHYDRAMINE HCL 25 MG PO CAPS
ORAL_CAPSULE | ORAL | Status: AC
  Filled 2020-02-11: qty 1

## 2020-02-11 MED ORDER — HYDROCODONE-ACETAMINOPHEN 5-325 MG PO TABS
1.0000 | ORAL_TABLET | ORAL | 0 refills | Status: AC | PRN
Start: 2020-02-11 — End: 2021-02-10

## 2020-02-11 MED ORDER — ONDANSETRON 4 MG PO TBDP
ORAL_TABLET | ORAL | Status: AC
  Filled 2020-02-11: qty 1

## 2020-02-11 MED ORDER — CIPROFLOXACIN HCL 500 MG PO TABS
500.0000 mg | ORAL_TABLET | ORAL | Status: AC
  Administered 2020-02-11: 500 mg via ORAL

## 2020-02-11 MED ORDER — DIAZEPAM 5 MG PO TABS
ORAL_TABLET | ORAL | Status: AC
  Filled 2020-02-11: qty 2

## 2020-02-11 MED ORDER — SODIUM CHLORIDE 0.9 % IV SOLN: INTRAVENOUS | Status: DC

## 2020-02-11 MED ORDER — CIPROFLOXACIN HCL 500 MG PO TABS
ORAL_TABLET | ORAL | Status: AC
  Filled 2020-02-11: qty 1

## 2020-02-11 MED ORDER — ONDANSETRON 4 MG PO TBDP
4.0000 mg | ORAL_TABLET | Freq: Once | ORAL | Status: AC
  Administered 2020-02-11: 4 mg via ORAL

## 2020-02-11 NOTE — Discharge Instructions (Signed)
Lithotripsy, Care After This sheet gives you information about how to care for yourself after your procedure. Your health care provider may also give you more specific instructions. If you have problems or questions, contact your health care provider. What can I expect after the procedure? After the procedure, it is common to have:  Some blood in your urine. This should only last for a few days.  Soreness in your back, sides, or upper abdomen for a few days.  Blotches or bruises on your back where the pressure wave entered the skin.  Pain, discomfort, or nausea when pieces (fragments) of the kidney stone move through the tube that carries urine from the kidney to the bladder (ureter). Stone fragments may pass soon after the procedure, but they may continue to pass for up to 4-8 weeks. ? If you have severe pain or nausea, contact your health care provider. This may be caused by a large stone that was not broken up, and this may mean that you need more treatment.  Some pain or discomfort during urination.  Some pain or discomfort in the lower abdomen or (in men) at the base of the penis. Follow these instructions at home: Medicines  Take over-the-counter and prescription medicines only as told by your health care provider.  If you were prescribed an antibiotic medicine, take it as told by your health care provider. Do not stop taking the antibiotic even if you start to feel better.  Do not drive for 24 hours if you were given a medicine to help you relax (sedative).  Do not drive or use heavy machinery while taking prescription pain medicine. Eating and drinking      Drink enough water and fluids to keep your urine clear or pale yellow. This helps any remaining pieces of the stone to pass. It can also help prevent new stones from forming.  Eat plenty of fresh fruits and vegetables.  Follow instructions from your health care provider about eating and drinking restrictions. You may be  instructed: ? To reduce how much salt (sodium) you eat or drink. Check ingredients and nutrition facts on packaged foods and beverages. ? To reduce how much meat you eat.  Eat the recommended amount of calcium for your age and gender. Ask your health care provider how much calcium you should have. General instructions  Get plenty of rest.  Most people can resume normal activities 1-2 days after the procedure. Ask your health care provider what activities are safe for you.  Your health care provider may direct you to lie in a certain position (postural drainage) and tap firmly (percuss) over your kidney area to help stone fragments pass. Follow instructions as told by your health care provider.  If directed, strain all urine through the strainer that was provided by your health care provider. ? Keep all fragments for your health care provider to see. Any stones that are found may be sent to a medical lab for examination. The stone may be as small as a grain of salt.  Keep all follow-up visits as told by your health care provider. This is important. Contact a health care provider if:  You have pain that is severe or does not get better with medicine.  You have nausea that is severe or does not go away.  You have blood in your urine longer than your health care provider told you to expect.  You have more blood in your urine.  You have pain during urination that does   not go away.  You urinate more frequently than usual and this does not go away.  You develop a rash or any other possible signs of an allergic reaction. Get help right away if:  You have severe pain in your back, sides, or upper abdomen.  You have severe pain while urinating.  Your urine is very dark red.  You have blood in your stool (feces).  You cannot pass any urine at all.  You feel a strong urge to urinate after emptying your bladder.  You have a fever or chills.  You develop shortness of breath,  difficulty breathing, or chest pain.  You have severe nausea that leads to persistent vomiting.  You faint. Summary  After this procedure, it is common to have some pain, discomfort, or nausea when pieces (fragments) of the kidney stone move through the tube that carries urine from the kidney to the bladder (ureter). If this pain or nausea is severe, however, you should contact your health care provider.  Most people can resume normal activities 1-2 days after the procedure. Ask your health care provider what activities are safe for you.  Drink enough water and fluids to keep your urine clear or pale yellow. This helps any remaining pieces of the stone to pass, and it can help prevent new stones from forming.  If directed, strain your urine and keep all fragments for your health care provider to see. Fragments or stones may be as small as a grain of salt.  Get help right away if you have severe pain in your back, sides, or upper abdomen or have severe pain while urinating. This information is not intended to replace advice given to you by your health care provider. Make sure you discuss any questions you have with your health care provider. Document Revised: 09/18/2018 Document Reviewed: 04/28/2016 Elsevier Patient Education  2020 Elsevier Inc.  Post Anesthesia Home Care Instructions  Activity: Get plenty of rest for the remainder of the day. A responsible individual must stay with you for 24 hours following the procedure.  For the next 24 hours, DO NOT: -Drive a car -Operate machinery -Drink alcoholic beverages -Take any medication unless instructed by your physician -Make any legal decisions or sign important papers.  Meals: Start with liquid foods such as gelatin or soup. Progress to regular foods as tolerated. Avoid greasy, spicy, heavy foods. If nausea and/or vomiting occur, drink only clear liquids until the nausea and/or vomiting subsides. Call your physician if vomiting  continues.      

## 2020-02-11 NOTE — Op Note (Signed)
See [facility] OP note scanned into chart. 

## 2020-02-12 ENCOUNTER — Encounter (HOSPITAL_BASED_OUTPATIENT_CLINIC_OR_DEPARTMENT_OTHER): Payer: Self-pay | Admitting: Urology

## 2020-02-12 ENCOUNTER — Other Ambulatory Visit: Payer: Self-pay

## 2020-02-12 DIAGNOSIS — F17210 Nicotine dependence, cigarettes, uncomplicated: Secondary | ICD-10-CM | POA: Diagnosis not present

## 2020-02-12 DIAGNOSIS — Z79899 Other long term (current) drug therapy: Secondary | ICD-10-CM | POA: Insufficient documentation

## 2020-02-12 DIAGNOSIS — R3129 Other microscopic hematuria: Secondary | ICD-10-CM | POA: Diagnosis not present

## 2020-02-12 DIAGNOSIS — N189 Chronic kidney disease, unspecified: Secondary | ICD-10-CM | POA: Insufficient documentation

## 2020-02-12 DIAGNOSIS — R112 Nausea with vomiting, unspecified: Secondary | ICD-10-CM | POA: Diagnosis not present

## 2020-02-12 NOTE — ED Triage Notes (Signed)
Pt had lithotripsy on the left kidney yesterday and had not been able to keep anything down since then, she also states that pain is a 10/10, she doesn't think that she's passed any stones yet Pt was given hydrocodone and zofran without relief

## 2020-02-13 ENCOUNTER — Emergency Department (HOSPITAL_COMMUNITY)
Admission: EM | Admit: 2020-02-13 | Discharge: 2020-02-13 | Disposition: A | Discharge status: Home / Self Care | Payer: Medicaid Other | Attending: Emergency Medicine | Admitting: Emergency Medicine

## 2020-02-13 DIAGNOSIS — R3129 Other microscopic hematuria: Secondary | ICD-10-CM

## 2020-02-13 DIAGNOSIS — R112 Nausea with vomiting, unspecified: Secondary | ICD-10-CM

## 2020-02-13 LAB — CBC WITH DIFFERENTIAL/PLATELET
Abs Immature Granulocytes: 0.02 10*3/uL (ref 0.00–0.07)
Basophils Absolute: 0 10*3/uL (ref 0.0–0.1)
Basophils Relative: 0 %
Eosinophils Absolute: 0.1 10*3/uL (ref 0.0–0.5)
Eosinophils Relative: 1 %
HCT: 33.9 % — ABNORMAL LOW (ref 36.0–46.0)
Hemoglobin: 9.8 g/dL — ABNORMAL LOW (ref 12.0–15.0)
Immature Granulocytes: 0 %
Lymphocytes Relative: 11 %
Lymphs Abs: 1.1 10*3/uL (ref 0.7–4.0)
MCH: 19.6 pg — ABNORMAL LOW (ref 26.0–34.0)
MCHC: 28.9 g/dL — ABNORMAL LOW (ref 30.0–36.0)
MCV: 67.8 fL — ABNORMAL LOW (ref 80.0–100.0)
Monocytes Absolute: 0.9 10*3/uL (ref 0.1–1.0)
Monocytes Relative: 9 %
Neutro Abs: 8 10*3/uL — ABNORMAL HIGH (ref 1.7–7.7)
Neutrophils Relative %: 79 %
Platelets: 439 10*3/uL — ABNORMAL HIGH (ref 150–400)
RBC: 5 MIL/uL (ref 3.87–5.11)
RDW: 19.7 % — ABNORMAL HIGH (ref 11.5–15.5)
WBC: 10.1 10*3/uL (ref 4.0–10.5)
nRBC: 0 % (ref 0.0–0.2)

## 2020-02-13 LAB — URINALYSIS, ROUTINE W REFLEX MICROSCOPIC
Bilirubin Urine: NEGATIVE
Glucose, UA: 50 mg/dL — AB
Ketones, ur: 5 mg/dL — AB
Nitrite: NEGATIVE
Protein, ur: 30 mg/dL — AB
RBC / HPF: >50 RBC/hpf — ABNORMAL HIGH (ref 0–5)
Specific Gravity, Urine: 1.015 (ref 1.005–1.030)
WBC, UA: >50 WBC/hpf — ABNORMAL HIGH (ref 0–5)
pH: 6 (ref 5.0–8.0)

## 2020-02-13 LAB — BASIC METABOLIC PANEL
Anion gap: 11 (ref 5–15)
BUN: 8 mg/dL (ref 6–20)
CO2: 22 mmol/L (ref 22–32)
Calcium: 8.8 mg/dL — ABNORMAL LOW (ref 8.9–10.3)
Chloride: 107 mmol/L (ref 98–111)
Creatinine, Ser: 0.93 mg/dL (ref 0.44–1.00)
GFR calc Af Amer: >60 mL/min (ref >60)
GFR calc non Af Amer: >60 mL/min (ref >60)
Glucose, Bld: 112 mg/dL — ABNORMAL HIGH (ref 70–99)
Potassium: 3.4 mmol/L — ABNORMAL LOW (ref 3.5–5.1)
Sodium: 140 mmol/L (ref 135–145)

## 2020-02-13 MED ORDER — KETOROLAC TROMETHAMINE 15 MG/ML IJ SOLN
15.0000 mg | Freq: Once | INTRAMUSCULAR | Status: AC
  Administered 2020-02-13: 15 mg via INTRAVENOUS
  Filled 2020-02-13: qty 1

## 2020-02-13 MED ORDER — SODIUM CHLORIDE 0.9 % IV SOLN
1000.0000 mL | INTRAVENOUS | Status: DC
  Administered 2020-02-13: 1000 mL via INTRAVENOUS

## 2020-02-13 MED ORDER — SODIUM CHLORIDE 0.9 % IV BOLUS (SEPSIS)
1000.0000 mL | Freq: Once | INTRAVENOUS | Status: AC
  Administered 2020-02-13: 1000 mL via INTRAVENOUS

## 2020-02-13 MED ORDER — METOCLOPRAMIDE HCL 5 MG/ML IJ SOLN
10.0000 mg | Freq: Once | INTRAMUSCULAR | Status: AC
  Administered 2020-02-13: 10 mg via INTRAVENOUS
  Filled 2020-02-13: qty 2

## 2020-02-13 MED ORDER — ALUM & MAG HYDROXIDE-SIMETH 200-200-20 MG/5ML PO SUSP
30.0000 mL | Freq: Once | ORAL | Status: AC
  Administered 2020-02-13: 30 mL via ORAL
  Filled 2020-02-13: qty 30

## 2020-02-13 MED ORDER — ONDANSETRON 4 MG PO TBDP: 4.0000 mg | ORAL_TABLET | Freq: Three times a day (TID) | ORAL | 0 refills | Status: AC | PRN

## 2020-02-13 NOTE — Discharge Instructions (Signed)
You may take 600mg  of ibuprofen every 6 hours and/or 650mg  of tylenol every 6 hours for pain control.

## 2020-02-13 NOTE — ED Notes (Signed)
Pt vomited x1 once placed in room 11.

## 2020-02-13 NOTE — ED Provider Notes (Signed)
[facility]-EMERGENCY DEPT Provider Note  CSN: [id number] Arrival date & time: 02/12/20 2126  Chief Complaint(s) No chief complaint on file.  HPI Sarah Barr is a 39 y.o. female with a history of renal stone lithotripsy yesterday who presents to the emergency department with decreased oral tolerance due to nausea and nonbloody nonbilious emesis.  Patient reports that her symptoms began immediately after the lithotripsy.  Believes it was due to the fentanyl.  Reports that this morning she was able to eat breakfast but her symptoms returned after she took her Vicodin her pain.  Since then she has been unable to keep anything down including fluids.  She denies any fevers or chills.  No other physical complaints other than her persistent left flank pain from the stone.  HPI  Past Medical History Past Medical History:  Diagnosis Date  . Anemia   . Chronic kidney disease 2008   kidney stones  . Headache   . Infection    UTI's in pregnancy  . Vaginal Pap smear, abnormal    There are no problems to display for this patient.  Home Medication(s) Prior to Admission medications   Medication Sig Start Date End Date Taking? Authorizing Provider  albuterol (VENTOLIN HFA) 108 (90 Base) MCG/ACT inhaler Inhale 2 puffs into the lungs every 6 (six) hours as needed for shortness of breath or wheezing. 08/14/19   [provider]  HYDROcodone-acetaminophen (NORCO/VICODIN) 5-325 MG tablet Take 1 tablet by mouth every 4 (four) hours as needed for moderate pain. 02/11/20 02/10/21  Marcine Matar, MD  ibuprofen (ADVIL) 600 MG tablet Take 1 tablet (600 mg total) by mouth every 6 (six) hours as needed. Patient taking differently: Take 600 mg by mouth every 6 (six) hours as needed for moderate pain.  12/10/19   Couture, Cortni S, PA-C  ondansetron (ZOFRAN ODT) 4 MG disintegrating tablet Take 1 tablet (4 mg total) by mouth every 8 (eight) hours as needed for up to 3 days for  nausea or vomiting. 02/13/20 02/16/20  Lizabeth Fellner, Amadeo Garnet, MD  ondansetron (ZOFRAN) 4 MG tablet Take 1 tablet (4 mg total) by mouth every 6 (six) hours. 01/18/20   Crist Fat, MD                                                                                                                                    Past Surgical History Past Surgical History:  Procedure Laterality Date  . CESAREAN SECTION     x's 3.   . CHOLECYSTECTOMY    . CYST REMOVAL HAND    . EXTRACORPOREAL SHOCK WAVE LITHOTRIPSY Left 02/11/2020   Procedure: EXTRACORPOREAL SHOCK WAVE LITHOTRIPSY (ESWL);  Surgeon: Marcine Matar, MD;  Location: [facility];  Service: Urology;  Laterality: Left;   Family History History reviewed. No pertinent family history.  Social History Social History   Tobacco Use  . Smoking status: Current Some Day  Smoker    Types: Cigarettes  . Smokeless tobacco: Never Used  Substance Use Topics  . Alcohol use: No  . Drug use: Yes    Types: Marijuana   Allergies Patient has no known allergies.  Review of Systems Review of Systems All other systems are reviewed and are negative for acute change except as noted in the HPI  Physical Exam Vital Signs  I have reviewed the triage vital signs BP (!) 176/88   Pulse 77   Temp 98 F (36.7 C)   Resp 15   Ht 5\' 7"  (1.702 m)   Wt 134.3 kg   LMP 02/04/2020 (Approximate) Comment: per pt neg upreg in dr's office last week  SpO2 97%   BMI 46.36 kg/m   Physical Exam Vitals reviewed.  Constitutional:      General: She is not in acute distress.    Appearance: She is well-developed. She is obese. She is not diaphoretic.  HENT:     Head: Normocephalic and atraumatic.     Right Ear: External ear normal.     Left Ear: External ear normal.     Nose: Nose normal.  Eyes:     General: No scleral icterus.    Conjunctiva/sclera: Conjunctivae normal.  Neck:     Trachea: Phonation normal.  Cardiovascular:     Rate and  Rhythm: Normal rate and regular rhythm.  Pulmonary:     Effort: Pulmonary effort is normal. No respiratory distress.     Breath sounds: No stridor.  Abdominal:     General: There is no distension.     Tenderness: There is abdominal tenderness. There is left CVA tenderness.  Musculoskeletal:        General: Normal range of motion.     Cervical back: Normal range of motion.  Neurological:     Mental Status: She is alert and oriented to person, place, and time.  Psychiatric:        Behavior: Behavior normal.     ED Results and Treatments Labs (all labs ordered are listed, but only abnormal results are displayed) Labs Reviewed  CBC WITH DIFFERENTIAL/PLATELET - Abnormal; Notable for the following components:      Result Value   Hemoglobin 9.8 (*)    HCT 33.9 (*)    MCV 67.8 (*)    MCH 19.6 (*)    MCHC 28.9 (*)    RDW 19.7 (*)    Platelets 439 (*)    Neutro Abs 8.0 (*)    All other components within normal limits  BASIC METABOLIC PANEL - Abnormal; Notable for the following components:   Potassium 3.4 (*)    Glucose, Bld 112 (*)    Calcium 8.8 (*)    All other components within normal limits  URINALYSIS, ROUTINE W REFLEX MICROSCOPIC - Abnormal; Notable for the following components:   APPearance HAZY (*)    Glucose, UA 50 (*)    Hgb urine dipstick LARGE (*)    Ketones, ur 5 (*)    Protein, ur 30 (*)    Leukocytes,Ua MODERATE (*)    RBC / HPF >50 (*)    WBC, UA >50 (*)    Bacteria, UA RARE (*)    All other components within normal limits  URINE CULTURE  EKG  EKG Interpretation  Date/Time:    Ventricular Rate:    PR Interval:    QRS Duration:   QT Interval:    QTC Calculation:   R Axis:     Text Interpretation:        Radiology No results found.  Pertinent labs & imaging results that were available during my care of the patient were reviewed  by me and considered in my medical decision making (see chart for details).  Medications Ordered in ED Medications  sodium chloride 0.9 % bolus 1,000 mL (0 mLs Intravenous Stopped 02/13/20 0339)    Followed by  0.9 %  sodium chloride infusion (1,000 mLs Intravenous New Bag/Given 02/13/20 0341)  ketorolac (TORADOL) 15 MG/ML injection 15 mg (15 mg Intravenous Given 02/13/20 0101)  metoCLOPramide (REGLAN) injection 10 mg (10 mg Intravenous Given 02/13/20 0102)  alum & mag hydroxide-simeth (MAALOX/MYLANTA) 200-200-20 MG/5ML suspension 30 mL (30 mLs Oral Given 02/13/20 2831)                                                                                                                                    Procedures Procedures  (including critical care time)  Medical Decision Making / ED Course I have reviewed the nursing notes for this encounter and the patient's prior records (if available in EHR or on provided paperwork).   Matina Barr was evaluated in Emergency Department on 02/13/2020 for the symptoms described in the history of present illness. She was evaluated in the context of the global COVID-19 pandemic, which necessitated consideration that the patient might be at risk for infection with the SARS-CoV-2 virus that causes COVID-19. Institutional protocols and algorithms that pertain to the evaluation of patients at risk for COVID-19 are in a state of rapid change based on information released by regulatory bodies including the CDC and federal and state organizations. These policies and algorithms were followed during the patient's care in the ED.    Clinical Course as of Feb 13 455  Wed Feb 13, 2020  0254 Possible medication side effects (narcotics). Labs reassuring.  UA with hematuria as expected. No overt infection. Will send culture.  Treated with toradol, reglan, and IVF.   [PC]  0454 Symptoms improved. Has been able to tolerate oral intake.   [PC]    Clinical Course User  Index [PC] Dmiya Malphrus, Amadeo Garnet, MD     Final Clinical Impression(s) / ED Diagnoses Final diagnoses:  Nausea and vomiting in adult  Other microscopic hematuria    The patient appears reasonably screened and/or stabilized for discharge and I doubt any other medical condition or other [facility] requiring further screening, evaluation, or treatment in the ED at this time prior to discharge. Safe for discharge with strict return precautions.  Disposition: Discharge  Condition: Good  I have discussed the results, Dx and Tx plan with the patient/family who expressed understanding and agree(s) with the plan. Discharge instructions  discussed at length. The patient/family was given strict return precautions who verbalized understanding of the instructions. No further questions at time of discharge.    ED Discharge Orders         Ordered    ondansetron (ZOFRAN ODT) 4 MG disintegrating tablet  Every 8 hours PRN        02/13/20 0452           Follow Up: Marcine Matar, MD [address] [place] [address] [phone]  Call  for urine culture results     This chart was dictated using voice recognition software.  Despite best efforts to proofread,  errors can occur which can change the documentation meaning.   Nira Conn, MD 02/13/20 [phone]

## 2020-02-15 ENCOUNTER — Other Ambulatory Visit (HOSPITAL_COMMUNITY): Payer: Medicaid Other

## 2020-02-15 LAB — URINE CULTURE: Culture: 50000 — AB

## 2021-08-06 IMAGING — DX DG ABDOMEN 1V
2 series · 2 of 2 positions shown · non-contrast
Comparison: None.

CLINICAL DATA: Left ureteral calculus.

EXAM:
ABDOMEN - 1 VIEW

[abdomen kub (1 of 2)]
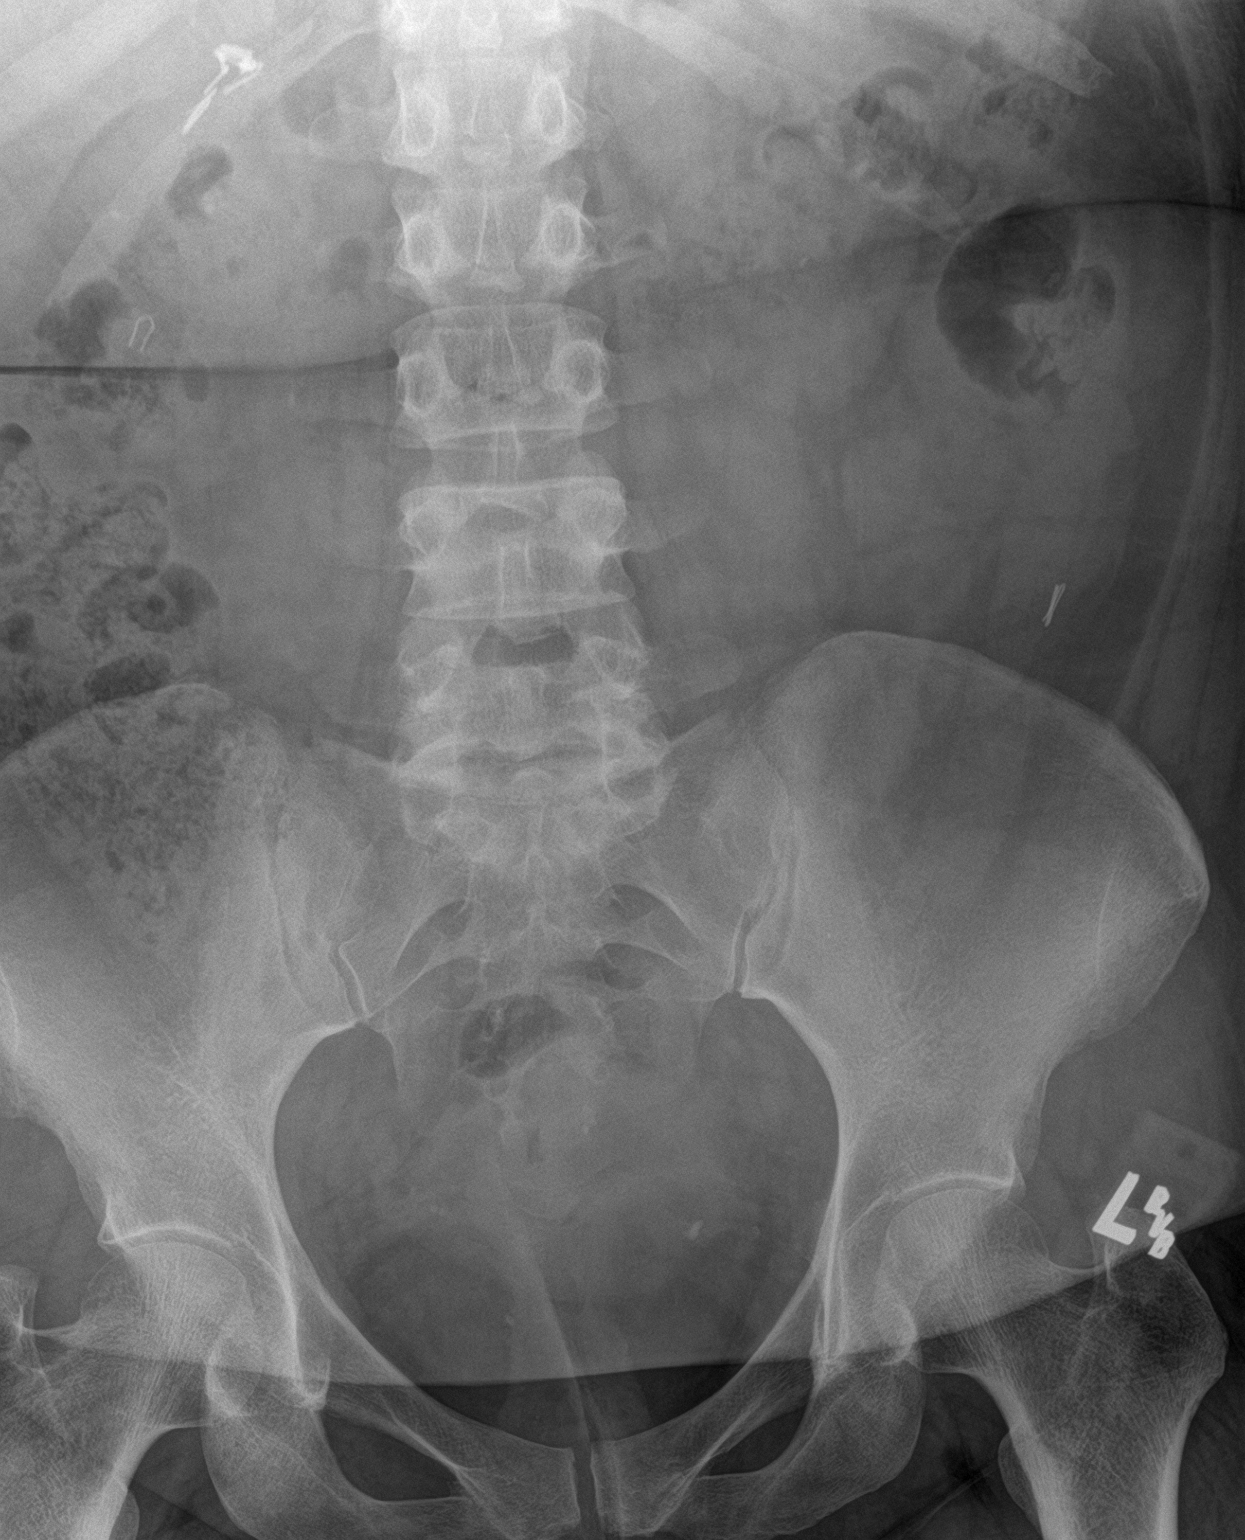

[abdomen kub (2 of 2)]
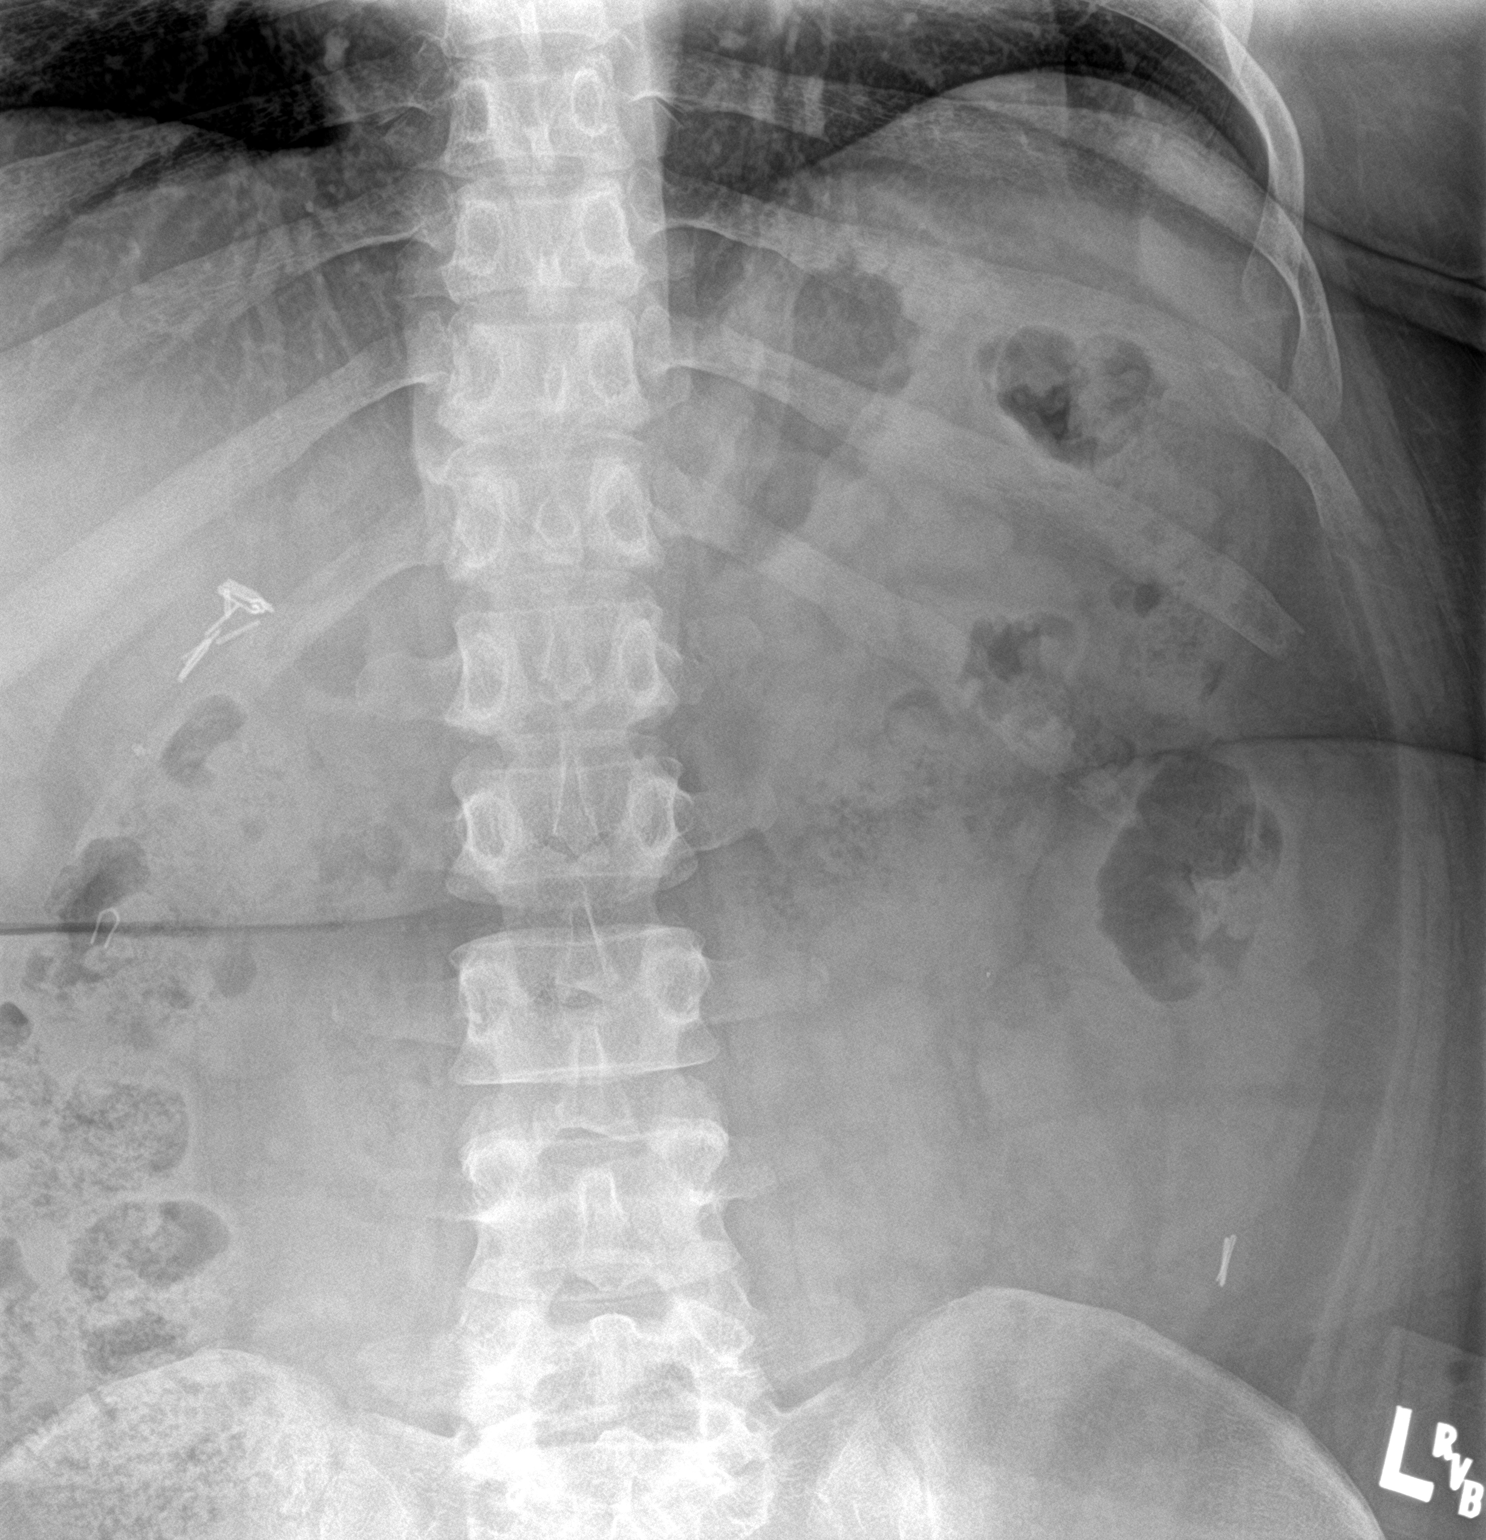

[2 of 2 positions shown; findings below may reference images not displayed]

FINDINGS: The bowel gas pattern is normal. 5 mm radiodensity is seen in the
left hemipelvis, consistent with a distal left ureteral calculus.
Visualization of kidneys is limited by overlying bowel gas. Right
upper quadrant surgical clips are consistent with prior
cholecystectomy.
IMPRESSION: 5 mm distal left ureteral calculus.

## 2021-09-25 ENCOUNTER — Other Ambulatory Visit: Payer: Self-pay | Admitting: Physician Assistant

## 2021-09-25 DIAGNOSIS — Z1231 Encounter for screening mammogram for malignant neoplasm of breast: Secondary | ICD-10-CM

## 2021-10-05 ENCOUNTER — Encounter: Payer: Self-pay | Admitting: *Deleted

## 2021-10-12 ENCOUNTER — Ambulatory Visit: Payer: Medicaid Other | Admitting: Cardiology

## 2021-10-12 ENCOUNTER — Encounter: Payer: Self-pay | Admitting: Cardiology

## 2021-10-12 VITALS — BP 138/86 | HR 80 | Temp 98.0°F | Resp 16 | Ht 67.0 in | Wt 308.2 lb

## 2021-10-12 DIAGNOSIS — R011 Cardiac murmur, unspecified: Secondary | ICD-10-CM

## 2021-10-12 DIAGNOSIS — R0609 Other forms of dyspnea: Secondary | ICD-10-CM

## 2021-10-12 DIAGNOSIS — R002 Palpitations: Secondary | ICD-10-CM

## 2021-10-12 NOTE — Progress Notes (Signed)
? ?Primary Physician/Referring:  Benito Mccreedy, MD ? ?Patient ID: Sarah Barr, female    DOB: 10/03/1980, 41 y.o.   MRN: [id number] ? ?Chief Complaint  ?Patient presents with  ? New Heart Murmur  ? Shortness of Breath  ?  Persistent   ? New Patient (Initial Visit)  ?  Referred by Raelyn Number, PA  ? ?HPI:   ? ?Sarah Barr  is a 41 y.o. African-American female patient with morbid obesity, reactive airway disease, prediabetes mellitus, and deficiency anemia due to chronic blood loss due to menorrhagia from uterine fibroids,, ureteric stones referred to me for evaluation of  shortness of breath, palpitations and heart murmur. 10 pack year history of smoking cigarettes quit Jan 2020.  ? ?Patient states that over the past several months, she has noticed exertional dyspnea, but no PND or orthopnea.  No leg edema.  She also notices her heart races when she does any physical activities and also when she gets nervous. ? ?No chest pain, no dizziness or syncope. ? ?Past Medical History:  ?Diagnosis Date  ? Anemia   ? Chronic kidney disease 2008  ? kidney stones  ? Headache   ? Infection   ? UTI's in pregnancy  ? Vaginal Pap smear, abnormal   ? ?Past Surgical History:  ?Procedure Laterality Date  ? CESAREAN SECTION    ? x's 3.   ? CHOLECYSTECTOMY    ? CYST REMOVAL HAND    ? EXTRACORPOREAL SHOCK WAVE LITHOTRIPSY Left 02/11/2020  ? Procedure: EXTRACORPOREAL SHOCK WAVE LITHOTRIPSY (ESWL);  Surgeon: Franchot Gallo, MD;  Location: [facility];  Service: Urology;  Laterality: Left;  ? ?Family History  ?Problem Relation Age of Onset  ? COPD Mother   ? Thyroid disease Sister   ? Fibromyalgia Sister   ? Asthma Sister   ?  ?Social History  ? ?Tobacco Use  ? Smoking status: Former  ?  Packs/day: 0.50  ?  Years: 21.00  ?  Pack years: 10.50  ?  Types: Cigarettes  ?  Quit date: 06/21/2018  ?  Years since quitting: 3.3  ? Smokeless tobacco: Never  ?Substance Use Topics  ? Alcohol use: No  ? ?Marital Status:  Married  ?ROS  ?Review of Systems  ?Cardiovascular:  Positive for dyspnea on exertion and palpitations. Negative for chest pain and leg swelling.  ?Objective  ?Blood pressure 138/86, pulse 80, temperature 98 ?F (36.7 ?C), temperature source Temporal, resp. rate 16, height 5' 7" (1.702 m), weight (!) 308 lb 3.2 oz (139.8 kg), SpO2 98 %. Body mass index is 48.27 kg/m?.  ? ?  10/12/2021  ?  1:19 PM 02/13/2020  ?  5:01 AM 02/13/2020  ?  3:38 AM  ?Vitals with BMI  ?Height 5' 7"    ?Weight 308 lbs 3 oz    ?BMI 48.26    ?Systolic 993 570 177  ?Diastolic 86 939 88  ?Pulse 80 58 77  ?  ?Physical Exam ?Constitutional:   ?   Appearance: She is morbidly obese.  ?Neck:  ?   Vascular: No JVD.  ?Cardiovascular:  ?   Rate and Rhythm: Normal rate and regular rhythm.  ?   Pulses: Intact distal pulses.  ?   Heart sounds: Normal heart sounds. No murmur heard. ?  No gallop.  ?Pulmonary:  ?   Effort: Pulmonary effort is normal.  ?   Breath sounds: Normal breath sounds.  ?Abdominal:  ?   General: Bowel sounds are normal.  ?  Palpations: Abdomen is soft.  ?Musculoskeletal:  ?   Right lower leg: No edema.  ?   Left lower leg: No edema.  ? ? ?Medications and allergies  ?No Known Allergies  ? ?Medication list after today's encounter  ? ?Current Outpatient Medications:  ?  ferrous sulfate 325 (65 FE) MG EC tablet, Take 1 tablet by mouth daily., Disp: , Rfl:  ?  SYMBICORT 80-4.5 MCG/ACT inhaler, Inhale 2 puffs into the lungs 2 (two) times daily., Disp: , Rfl:  ? ?Laboratory examination:  ? ? ? ?  Latest Ref Rng & Units 02/13/2020  ? 12:28 AM 12/10/2019  ?  4:45 PM 01/28/2019  ?  1:10 PM  ?CBC  ?WBC 4.0 - 10.5 K/uL 10.1    6.5    ?Hemoglobin 12.0 - 15.0 g/dL 9.8   10.5   9.3    ?Hematocrit 36.0 - 46.0 % 33.9   31.0   32.5    ?Platelets 150 - 400 K/uL 439    397    ? ?External labs:  ? ?Labs 07/27/2021: ? ?Total iron 14, iron binding capacity 400,  %saturation 4, markedly reduced.  Serum ferritin 3, markedly reduced. ? ?Serum glucose 107 mg, BUN 9,  creatinine 0.66, EGFR 112 mL, potassium 3.9, sodium 139. ? ?Hb 8.4/HCT 29.7, platelets 484.  Microcytic indicis. ? ?Radiology:  ? ?CT of the abdomen for 423 for renal stone study: ?1. No acute findings in the abdomen or pelvis.  ?2. Bilateral nonobstructing stones.  ?3. Solid nodule of the left lower lobe measuring 4 mm in mean  diameter. No follow-up needed if patient is low-risk.This  ?recommendation follows the consensus statement: Guidelines for  Management of Incidental Pulmonary Nodules Detected on CT Images:  ?From the Fleischner Society 2017; Radiology 2017; 284:228-243.  ?Cardiac Studies:  ? ?NA ? ?EKG:  ? ?EKG 10/12/2021: Normal sinus rhythm at rate of 76 bpm, normal EKG. ?PCP EKG 07/27/2021: Normal sinus rhythm with rate of 85 bpm, normal axis.  No evidence of ischemia, normal EKG. ? ?Assessment  ? ?  ICD-10-CM   ?1. Heart murmur  R01.1 EKG 12-Lead  ?  PCV ECHOCARDIOGRAM COMPLETE  ?  ?2. Palpitations  R00.2   ?  ?3. Dyspnea on exertion  R06.09 PCV ECHOCARDIOGRAM COMPLETE  ?  ?  ?There are no discontinued medications.  ?No orders of the defined types were placed in this encounter. ? ?Orders Placed This Encounter  ?Procedures  ? EKG 12-Lead  ? PCV ECHOCARDIOGRAM COMPLETE  ?  Standing Status:   Future  ?  Standing Expiration Date:   10/13/2022  ? ?Recommendations:  ? ?Sarah Barr is a 41 y.o.  African-American female patient with morbid obesity, reactive airway disease, prediabetes mellitus, and deficiency anemia due to chronic blood loss due to menorrhagia from uterine fibroids,, ureteric stones referred to me for evaluation of  shortness of breath, palpitations and heart murmur. 10 pack year history of smoking cigarettes quit Jan 2020.  ? ?Patient symptoms of dyspnea on exertion is related to obesity hypoventilation.  We discussed regarding weight loss and calorie reduction.  Do not suspect heart failure.  Her physical examination is completely normal except for a very soft systolic murmur which is  physiologic due to severe anemia of chronic blood loss.  I have highly recommended that she proceed with GYN evaluation for menorrhagia. ? ?Palpitation symptoms are also related to severe anemia.  I will perform Barr echocardiogram, unless she has structural abnormality, no further cardiac evaluation  is indicated.  Her physical examination is otherwise unremarkable except for obesity.  I will see her back on a as needed basis.  I do not see a TSH, she will need this and probably a lipid panel for primary prevention strategies. ? ? ? ?Adrian Prows, MD, [facility] ?10/12/2021, 2:14 PM ?Office: [phone] ?

## 2021-10-22 ENCOUNTER — Ambulatory Visit: Payer: Medicaid Other

## 2021-10-22 DIAGNOSIS — R011 Cardiac murmur, unspecified: Secondary | ICD-10-CM

## 2021-10-22 DIAGNOSIS — R0609 Other forms of dyspnea: Secondary | ICD-10-CM

## 2021-10-23 ENCOUNTER — Other Ambulatory Visit: Payer: Medicaid Other

## 2021-11-04 ENCOUNTER — Institutional Professional Consult (permissible substitution): Payer: Medicaid Other | Admitting: Pulmonary Disease

## 2021-11-04 NOTE — Progress Notes (Deleted)
    Subjective:   PATIENT ID: Sarah Barr GENDER: female DOB: 1981-01-17, MRN: [id number]   HPI  No chief complaint on file.   Reason for Visit: ***Follow-up ***New consult for ***  *** Social History:  Environmental exposures: ***  I have personally reviewed patient's past medical/family/social history, allergies, current medications.***  Past Medical History:  Diagnosis Date   Anemia    Chronic kidney disease 2008   kidney stones   Headache    Infection    UTI's in pregnancy   Vaginal Pap smear, abnormal      Family History  Problem Relation Age of Onset   COPD Mother    Thyroid disease Sister    Fibromyalgia Sister    Asthma Sister      Social History   Occupational History   Not on file  Tobacco Use   Smoking status: Former    Packs/day: 0.50    Years: 21.00    Pack years: 10.50    Types: Cigarettes    Quit date: 06/21/2018    Years since quitting: 3.3   Smokeless tobacco: Never  Vaping Use   Vaping Use: Never used  Substance and Sexual Activity   Alcohol use: No   Drug use: Not Currently    Types: Marijuana    Comment: Quit April 1st   Sexual activity: Yes    Birth control/protection: Surgical    No Known Allergies   Outpatient Medications Prior to Visit  Medication Sig Dispense Refill   ferrous sulfate 325 (65 FE) MG EC tablet Take 1 tablet by mouth daily.     SYMBICORT 80-4.5 MCG/ACT inhaler Inhale 2 puffs into the lungs 2 (two) times daily.     No facility-administered medications prior to visit.    ROS   Objective:  There were no vitals filed for this visit.    Physical Exam: General: Well-appearing, no acute distress HENT: [place], AT, OP clear, MMM Eyes: EOMI, no scleral icterus Respiratory: Clear to auscultation bilaterally.  No crackles, wheezing or rales Cardiovascular: RRR, -M/R/G, no JVD GI: BS+, soft, nontender Extremities:-Edema,-tenderness Neuro: AAO x4, CNII-XII grossly intact Skin: Intact, no rashes or  bruising Psych: Normal mood, normal affect  Data Reviewed:  Imaging: CT Renal Stone Study 12/10/19 - Lung fields with left basilar atelectasis CT Stone Study 09/22/21 (report only): No acute A/P findings. Bilateral nonobstructing stones. Solid nodule of LLL measuring 4 mm  PFT:  Labs:      Assessment & Plan:   Discussion: ***  CT Chest without contrast  Health Maintenance  There is no immunization history on file for this patient. CT Lung Screen***  No orders of the defined types were placed in this encounter. No orders of the defined types were placed in this encounter.   No follow-ups on file.  I have spent a total time of***-minutes on the day of the appointment reviewing prior documentation, coordinating care and discussing medical diagnosis and plan with the patient/family. Imaging, labs and tests included in this note have been reviewed and interpreted independently by me.  Araseli Sherry Mechele Collin, MD [facility] 11/04/2021 1:53 PM  Office Number [phone]

## 2021-11-19 ENCOUNTER — Institutional Professional Consult (permissible substitution): Payer: Medicaid Other | Admitting: Pulmonary Disease

## 2021-11-19 NOTE — Progress Notes (Deleted)
Synopsis: Referred in June 2023 for lung nodule by Jackie Plum, MD  Subjective:   PATIENT ID: Sarah Barr GENDER: female DOB: 1981/03/27, MRN: [id number]  No chief complaint on file.   This is a 41 year old female, past medical history of chronic kidney disease, anemia, headache.No family history of lung cancer.  Former smoker half pack a day for 21 years.    ***  Past Medical History:  Diagnosis Date   Anemia    Chronic kidney disease 2008   kidney stones   Headache    Infection    UTI's in pregnancy   Vaginal Pap smear, abnormal      Family History  Problem Relation Age of Onset   COPD Mother    Thyroid disease Sister    Fibromyalgia Sister    Asthma Sister      Past Surgical History:  Procedure Laterality Date   CESAREAN SECTION     x's 3.    CHOLECYSTECTOMY     CYST REMOVAL HAND     EXTRACORPOREAL SHOCK WAVE LITHOTRIPSY Left 02/11/2020   Procedure: EXTRACORPOREAL SHOCK WAVE LITHOTRIPSY (ESWL);  Surgeon: Marcine Matar, MD;  Location[facility];  Service: Urology;  Laterality: Left;    Social History   Socioeconomic History   Marital status: Married    Spouse name: Not on file   Number of children: 3   Years of education: Not on file   Highest education level: Not on file  Occupational History   Not on file  Tobacco Use   Smoking status: Former    Packs/day: 0.50    Years: 21.00    Pack years: 10.50    Types: Cigarettes    Quit date: 06/21/2018    Years since quitting: 3.4   Smokeless tobacco: Never  Vaping Use   Vaping Use: Never used  Substance and Sexual Activity   Alcohol use: No   Drug use: Not Currently    Types: Marijuana    Comment: Quit April 1st   Sexual activity: Yes    Birth control/protection: Surgical  Other Topics Concern   Not on file  Social History Narrative   Not on file   Social Determinants of Health   Financial Resource Strain: Not on file  Food Insecurity: Not on file   Transportation Needs: Not on file  Physical Activity: Not on file  Stress: Not on file  Social Connections: Not on file  Intimate Partner Violence: Not on file     No Known Allergies   Outpatient Medications Prior to Visit  Medication Sig Dispense Refill   ferrous sulfate 325 (65 FE) MG EC tablet Take 1 tablet by mouth daily.     SYMBICORT 80-4.5 MCG/ACT inhaler Inhale 2 puffs into the lungs 2 (two) times daily.     No facility-administered medications prior to visit.    ROS   Objective:  Physical Exam   There were no vitals filed for this visit.   on *** LPM *** RA BMI Readings from Last 3 Encounters:  10/12/21 48.27 kg/m  02/12/20 46.36 kg/m  02/11/20 46.36 kg/m   Wt Readings from Last 3 Encounters:  10/12/21 (!) 308 lb 3.2 oz (139.8 kg)  02/12/20 296 lb (134.3 kg)  02/11/20 296 lb (134.3 kg)     CBC    Component Value Date/Time   WBC 10.1 02/13/2020 0028   RBC 5.00 02/13/2020 0028   HGB 9.8 (L) 02/13/2020 0028   HCT 33.9 (L) 02/13/2020 0028  PLT 439 (H) 02/13/2020 0028   MCV 67.8 (L) 02/13/2020 0028   MCH 19.6 (L) 02/13/2020 0028   MCHC 28.9 (L) 02/13/2020 0028   RDW 19.7 (H) 02/13/2020 0028   LYMPHSABS 1.1 02/13/2020 0028   MONOABS 0.9 02/13/2020 0028   EOSABS 0.1 02/13/2020 0028   BASOSABS 0.0 02/13/2020 0028    ***  Chest Imaging: ***  Pulmonary Functions Testing Results:     View : No data to display.          FeNO: ***  Pathology: ***  Echocardiogram: ***  Heart Catheterization: ***    Assessment & Plan:   No diagnosis found.  Discussion: ***   Current Outpatient Medications:    ferrous sulfate 325 (65 FE) MG EC tablet, Take 1 tablet by mouth daily., Disp: , Rfl:    SYMBICORT 80-4.5 MCG/ACT inhaler, Inhale 2 puffs into the lungs 2 (two) times daily., Disp: , Rfl:   I spent *** minutes dedicated to the care of this patient on the date of this encounter to include pre-visit review of records, face-to-face time with  the patient discussing conditions above, post visit ordering of testing, clinical documentation with the electronic health record, making appropriate referrals as documented, and communicating necessary findings to members of the patients care team.   Josephine Igo, DO [facility] 11/19/2021 3:11 PM

## 2021-12-08 ENCOUNTER — Encounter: Payer: Medicaid Other | Admitting: Family Medicine

## 2022-01-01 ENCOUNTER — Institutional Professional Consult (permissible substitution): Payer: Medicaid Other | Admitting: Pulmonary Disease

## 2022-01-19 ENCOUNTER — Ambulatory Visit (INDEPENDENT_AMBULATORY_CARE_PROVIDER_SITE_OTHER): Payer: Commercial Managed Care - HMO | Admitting: Pulmonary Disease

## 2022-01-19 ENCOUNTER — Encounter: Payer: Self-pay | Admitting: Pulmonary Disease

## 2022-01-19 VITALS — BP 130/82 | HR 91 | Temp 98.6°F | Ht 68.0 in | Wt 340.0 lb

## 2022-01-19 DIAGNOSIS — R0609 Other forms of dyspnea: Secondary | ICD-10-CM

## 2022-01-19 DIAGNOSIS — R911 Solitary pulmonary nodule: Secondary | ICD-10-CM | POA: Diagnosis not present

## 2022-01-19 NOTE — Patient Instructions (Signed)
Nice to meet you  I think the small nodule seen on the CT scan a few months ago is likely related to reflux  Out of abundance of caution I recommend to get a full CT scan of your chest and reevaluate any nodules that may be present  In addition for the shortness of breath I would like to get pulmonary function tests for further evaluation to see if we can find reasons why you are short of breath  I would like to get both of these things done in the next couple of weeks and meet with me in follow-up in the next couple of weeks to discuss results and next steps if any are needed.    Return to clinic in 2 to 4 weeks or sooner as needed with Dr. Judeth Horn

## 2022-01-19 NOTE — Progress Notes (Signed)
@Patient  ID: , female    DOB: May 08, 1981, 41 y.o.   MRN: [id number]  Chief Complaint  Patient presents with   Consult    Pt is a consult for pulmonary nodules. Pt states she had CT scan done and it showed pulmonary nodules. Pt states last CT scan was May 2023. Pt states that she does not have the same energy level as before. Pt states walking up and down stairs she gets short winded. Pt is on Symbicort daily. Pt states it helps sometimes.     Referring provider: June 2023, MD  HPI:   41 y.o. woman whom we are seeing in consultation for evaluation of pulmonary nodule.  Note from referring provider reviewed.  Patient has history of renal stones.  She was having pain and discomfort 09/2021.  Went to ED.  CT renal protocol was obtained.  This demonstrated on my review interpretation of visible lung fields clear lungs with tiny 4 mm nodule left lower lobe.  CT scan renal stone protocol 11/2019 reviewed and interpreted as clear lungs bilaterally without evidence of nodule visible lung fields.  She is a former smoker.  Quit 2020.  Reports history of lung cancer in first-degree relatives.  She endorses reflux and heartburn symptoms.  Very frequently to the week.  Has been occurring for some time.  In addition she reports dyspnea on exertion.  First noted around the time of results of her CT scan.  Short of breath walking distances, up inclines or stairs etc.  No time of day when things are better or worse.  No position that makes things better or worse.  No seasonal environmental factors she can identify that make things better or worse.  No other relieving or exacerbating  PMH: Iron deficiency, asthma Surgical history: Gallbladder surgery/cholecystectomy, tubal ligation Family history: Asthma in mother and siblings, lung cancer in family members, Social history: Former smoker, quit January 2020, lives in [place] / Pulmonary Flowsheets:   ACT:      No  data to display          MMRC:     No data to display          Epworth:      No data to display          Tests:   FENO:  No results found for: "NITRICOXIDE"  PFT:     No data to display          WALK:      No data to display          Imaging: Personally reviewed and as per EMR discussion this note No results found.  Lab Results: Personally reviewed CBC    Component Value Date/Time   WBC 10.1 02/13/2020 0028   RBC 5.00 02/13/2020 0028   HGB 9.8 (L) 02/13/2020 0028   HCT 33.9 (L) 02/13/2020 0028   PLT 439 (H) 02/13/2020 0028   MCV 67.8 (L) 02/13/2020 0028   MCH 19.6 (L) 02/13/2020 0028   MCHC 28.9 (L) 02/13/2020 0028   RDW 19.7 (H) 02/13/2020 0028   LYMPHSABS 1.1 02/13/2020 0028   MONOABS 0.9 02/13/2020 0028   EOSABS 0.1 02/13/2020 0028   BASOSABS 0.0 02/13/2020 0028    BMET    Component Value Date/Time   NA 140 02/13/2020 0028   K 3.4 (L) 02/13/2020 0028   CL 107 02/13/2020 0028   CO2 22 02/13/2020 0028   GLUCOSE 112 (H) 02/13/2020 0028  BUN 8 02/13/2020 0028   CREATININE 0.93 02/13/2020 0028   CALCIUM 8.8 (L) 02/13/2020 0028   GFRNONAA >60 02/13/2020 0028   GFRAA >60 02/13/2020 0028    BNP No results found for: "BNP"  ProBNP No results found for: "PROBNP"  Specialty Problems   None   No Known Allergies   There is no immunization history on file for this patient.  Past Medical History:  Diagnosis Date   Anemia    Chronic kidney disease 2008   kidney stones   Headache    Infection    UTI's in pregnancy   Vaginal Pap smear, abnormal     Tobacco History: Social History   Tobacco Use  Smoking Status Former   Packs/day: 0.50   Years: 21.00   Total pack years: 10.50   Types: Cigarettes   Quit date: 06/21/2018   Years since quitting: 3.5  Smokeless Tobacco Never   Counseling given: Not Answered   Continue to not smoke  Outpatient Encounter Medications as of 01/19/2022  Medication Sig   ferrous sulfate  325 (65 FE) MG EC tablet Take 1 tablet by mouth daily.   SYMBICORT 80-4.5 MCG/ACT inhaler Inhale 2 puffs into the lungs 2 (two) times daily.   No facility-administered encounter medications on file as of 01/19/2022.     Review of Systems  Review of Systems  No chest pain with exertion.  No orthopnea or PND.  No lower extremity swelling.  Comprehensive review of systems otherwise negative. Physical Exam  Pulse 91   Temp 98.6 F (37 C) (Oral)   Ht 5\' 8"  (1.727 m)   Wt (!) 340 lb (154.2 kg)   SpO2 99%   BMI 51.70 kg/m   Wt Readings from Last 5 Encounters:  01/19/22 (!) 340 lb (154.2 kg)  10/12/21 (!) 308 lb 3.2 oz (139.8 kg)  02/12/20 296 lb (134.3 kg)  02/11/20 296 lb (134.3 kg)  12/17/17 294 lb (133.4 kg)    BMI Readings from Last 5 Encounters:  01/19/22 51.70 kg/m  10/12/21 48.27 kg/m  02/12/20 46.36 kg/m  02/11/20 46.36 kg/m  12/17/17 46.05 kg/m     Physical Exam General: Sitting in chair, in no acute distress Eyes: EOMI, no icterus Neck: Supple, no JVP Pulmonary: Clear, normal work of breathing Cardiovascular: Regular rate and rhythm, no murmur Abdomen: Nondistended, bowel sounds present MSK: No synovitis, no joint effusion Neuro: Normal gait, no weakness Psych: Normal mood, full affect   Assessment & Plan:   Left lower lobe lung nodule: Incidentally discovered 4 mm left lower lobe nodule on CT renal stone protocol.  2.1% risk of malignancy via Mayo nodule risk calculator. CT chest now for further evaluation.  This is driven by former smoking as well as family history of lung cancer.  Given description of uncontrolled GERD symptoms, suspect this represents sequela of reflux, silent aspiration.  Dyspnea on exertion: Present for a few months.  Only really noticeable after results of CT scan.  High suspicion for deconditioning, chest wall interference, weight.  PFTs for further evaluation.   Return in about 3 weeks (around 02/09/2022).   02/11/2022, MD 01/19/2022

## 2022-01-22 ENCOUNTER — Ambulatory Visit (INDEPENDENT_AMBULATORY_CARE_PROVIDER_SITE_OTHER): Payer: Commercial Managed Care - HMO | Admitting: Pulmonary Disease

## 2022-01-22 DIAGNOSIS — R0609 Other forms of dyspnea: Secondary | ICD-10-CM

## 2022-01-22 LAB — PULMONARY FUNCTION TEST
DL/VA % pred: 105 %
DL/VA: 4.5 ml/min/mmHg/L
DLCO cor % pred: 80 %
DLCO cor: 19.91 ml/min/mmHg
DLCO unc % pred: 80 %
DLCO unc: 19.91 ml/min/mmHg
FEF 25-75 Post: 3.34 L/sec
FEF 25-75 Pre: 2.74 L/sec
FEF2575-%Change-Post: 21 %
FEF2575-%Pred-Post: 102 %
FEF2575-%Pred-Pre: 84 %
FEV1-%Change-Post: 3 %
FEV1-%Pred-Post: 79 %
FEV1-%Pred-Pre: 76 %
FEV1-Post: 2.67 L
FEV1-Pre: 2.58 L
FEV1FVC-%Change-Post: 8 %
FEV1FVC-%Pred-Pre: 101 %
FEV6-%Change-Post: -4 %
FEV6-%Pred-Post: 73 %
FEV6-%Pred-Pre: 76 %
FEV6-Post: 2.98 L
FEV6-Pre: 3.12 L
FEV6FVC-%Pred-Post: 102 %
FEV6FVC-%Pred-Pre: 102 %
FVC-%Change-Post: -4 %
FVC-%Pred-Post: 71 %
FVC-%Pred-Pre: 75 %
FVC-Post: 2.98 L
FVC-Pre: 3.12 L
Post FEV1/FVC ratio: 90 %
Post FEV6/FVC ratio: 100 %
Pre FEV1/FVC ratio: 83 %
Pre FEV6/FVC Ratio: 100 %
RV % pred: 88 %
RV: 1.63 L
TLC % pred: 87 %
TLC: 4.97 L

## 2022-01-22 NOTE — Progress Notes (Signed)
Full PFT performed today. °

## 2022-01-22 NOTE — Patient Instructions (Signed)
Full PFT performed today. °

## 2022-01-23 NOTE — Progress Notes (Signed)
PFT are largely normal - this is good news!

## 2022-01-30 NOTE — Progress Notes (Deleted)
   ANNUAL EXAM Patient name: Sarah Barr MRN [id number]  Date of birth: [birth year] Chief Complaint:   No chief complaint on file.  History of Present Illness:   Sarah Barr is a 41 y.o. [phone] female being seen today for a routine annual exam.   Current complaints: ***  She has a history of c-sections x3. ***  She last had a TVUS in 2019 which showed a 2 cm intramural fibroid at the fundus and a possible polyp at the cervix. ***  No LMP recorded.  Current birth control: ***  Last pap: ***. Results were: {Pap findings:25134}. H/O abnormal pap: yes *** Last MXR: none on file  Health Maintenance Due  Topic Date Due   COVID-19 Vaccine (1) Never done   Hepatitis C Screening  Never done   TETANUS/TDAP  Never done   PAP SMEAR-Modifier  Never done   INFLUENZA VACCINE  01/19/2022    Review of Systems:   Pertinent items are noted in HPI Denies any headaches, blurred vision, fatigue, shortness of breath, chest pain, abdominal pain, abnormal vaginal discharge/itching/odor/irritation, problems with periods, bowel movements, urination, or intercourse unless otherwise stated above. *** Pertinent History Reviewed:  Reviewed past medical,surgical, social and family history.  Reviewed problem list, medications and allergies. Physical Assessment:  There were no vitals filed for this visit.There is no height or weight on file to calculate BMI.   Physical Examination:  General appearance - well appearing, and in no distress Mental status - alert, oriented to person, place, and time Psych:  She has a normal mood and affect Skin - warm and dry, normal color, no suspicious lesions noted Chest - effort normal Heart - normal rate  Breasts - breasts appear normal, no suspicious masses, no skin or nipple changes or axillary nodes Abdomen - soft, nontender, nondistended, no masses or organomegaly Pelvic -  VULVA: normal appearing vulva with no masses, tenderness or lesions  VAGINA:  normal appearing vagina with normal color and discharge, no lesions  CERVIX: normal appearing cervix without discharge or lesions, no CMT UTERUS: uterus is felt to be normal size, shape, consistency and nontender  ADNEXA: No adnexal masses or tenderness noted. Extremities:  No swelling or varicosities noted  Chaperone present for exam  No results found for this or any previous visit (from the past 24 hour(s)).  Assessment & Plan:  Diagnoses and all orders for this visit:  Encounter for annual routine gynecological examination  Irregular periods  - Cervical cancer screening: Discussed guidelines. Pap with HPV done - STD Testing: accepts - Birth Control: Discussed options and their risks, benefits and common side effects; discussed VTE with estrogen containing options. Desires: {Birth control type:23956} - Breast Health: Encouraged self breast awareness/SBE. Teaching provided. Discussed limits of clinical breast exam for detecting breast cancer. Rx given for MXR - F/U 12 months and prn    - For irregular bleeding, recommend a repeat US and follow up from pap and cultures done today.  - Would then do follow up after her [place] for next step which would likely be EMB due to risk factors for hyperplasia.     No orders of the defined types were placed in this encounter.   Meds: No orders of the defined types were placed in this encounter.   Follow-up: No follow-ups on file.  Milas Hock, MD 01/30/2022 12:22 PM

## 2022-02-01 ENCOUNTER — Encounter: Payer: Medicaid Other | Admitting: Obstetrics and Gynecology

## 2022-02-01 DIAGNOSIS — Z01419 Encounter for gynecological examination (general) (routine) without abnormal findings: Secondary | ICD-10-CM

## 2022-02-01 DIAGNOSIS — N926 Irregular menstruation, unspecified: Secondary | ICD-10-CM

## 2022-02-03 ENCOUNTER — Ambulatory Visit (HOSPITAL_COMMUNITY): Payer: Commercial Managed Care - HMO

## 2022-02-10 ENCOUNTER — Ambulatory Visit: Payer: Commercial Managed Care - HMO | Admitting: Pulmonary Disease

## 2022-02-15 ENCOUNTER — Ambulatory Visit
Admission: RE | Admit: 2022-02-15 | Discharge: 2022-02-15 | Disposition: A | Discharge status: Home / Self Care | Payer: Commercial Managed Care - HMO | Source: Ambulatory Visit | Attending: Pulmonary Disease | Admitting: Pulmonary Disease

## 2022-02-15 DIAGNOSIS — R911 Solitary pulmonary nodule: Secondary | ICD-10-CM

## 2022-02-18 ENCOUNTER — Emergency Department (HOSPITAL_COMMUNITY)
Admission: EM | Admit: 2022-02-18 | Discharge: 2022-02-19 | Disposition: A | Discharge status: Home / Self Care | Payer: Commercial Managed Care - HMO | Attending: Emergency Medicine | Admitting: Emergency Medicine

## 2022-02-18 ENCOUNTER — Encounter (HOSPITAL_COMMUNITY): Payer: Self-pay | Admitting: Emergency Medicine

## 2022-02-18 ENCOUNTER — Other Ambulatory Visit: Payer: Self-pay

## 2022-02-18 ENCOUNTER — Emergency Department (HOSPITAL_COMMUNITY): Payer: Commercial Managed Care - HMO

## 2022-02-18 DIAGNOSIS — M25571 Pain in right ankle and joints of right foot: Secondary | ICD-10-CM | POA: Insufficient documentation

## 2022-02-18 DIAGNOSIS — M7989 Other specified soft tissue disorders: Secondary | ICD-10-CM | POA: Diagnosis not present

## 2022-02-18 NOTE — ED Provider Notes (Addendum)
[facility] EMERGENCY DEPARTMENT Provider Note   CSN: [id number] Arrival date & time: 02/18/22  2022     History  Chief Complaint  Patient presents with   Foot Pain     Sarah Barr is a 41 y.o. female.  The history is provided by the patient and medical records.   41 y.o. F here with right ankle pain.  Has been having pain x2 weeks but acutely worse today while coming out of church.  States she got excited and started running around and felt a "pop".  Denies any fall or twisting mechanism to the ankle.  Now having increased pain with walking.  Denies numbness/tingling.  She has made appt with podiatry for next week.  Took aleve PTA.  Home Medications Prior to Admission medications   Medication Sig Start Date End Date Taking? Authorizing Provider  naproxen (NAPROSYN) 500 MG tablet Take 1 tablet (500 mg total) by mouth 2 (two) times daily. 02/19/22  Yes Garlon Hatchet, PA-C  ferrous sulfate 325 (65 FE) MG EC tablet Take 1 tablet by mouth daily. 08/04/21   [provider]  SYMBICORT 80-4.5 MCG/ACT inhaler Inhale 2 puffs into the lungs 2 (two) times daily. 09/09/21   [provider]      Allergies    Patient has no known allergies.    Review of Systems   Review of Systems  Musculoskeletal:  Positive for arthralgias.  All other systems reviewed and are negative.   Physical Exam Updated Vital Signs BP (!) 126/105   Pulse 92   Temp 98.6 F (37 C) (Oral)   Resp 16   LMP 01/28/2022   SpO2 100%   Physical Exam Vitals and nursing note reviewed.  Constitutional:      Appearance: She is well-developed.  HENT:     Head: Normocephalic and atraumatic.  Eyes:     Conjunctiva/sclera: Conjunctivae normal.     Pupils: Pupils are equal, round, and reactive to light.  Cardiovascular:     Rate and Rhythm: Normal rate and regular rhythm.     Heart sounds: Normal heart sounds.  Pulmonary:     Effort: Pulmonary effort is normal.     Breath  sounds: Normal breath sounds.  Abdominal:     General: Bowel sounds are normal.     Palpations: Abdomen is soft.  Musculoskeletal:        General: Normal range of motion.     Cervical back: Normal range of motion.     Comments: Very minimal swelling noted to lateral right ankle, no deformity, no significant pain with ROM, DP pulse intact, normal sensation  Skin:    General: Skin is warm and dry.  Neurological:     Mental Status: She is alert and oriented to person, place, and time.     ED Results / Procedures / Treatments   Labs (all labs ordered are listed, but only abnormal results are displayed) Labs Reviewed - No data to display  EKG None  Radiology DG Ankle Complete Right  Result Date: 02/18/2022 CLINICAL DATA:  Right ankle pain for 2 weeks. EXAM: RIGHT ANKLE - COMPLETE 3+ VIEW COMPARISON:  None Available. FINDINGS: There is no evidence of fracture, dislocation, or joint effusion. There is no evidence of arthropathy or other focal bone abnormality. Soft tissues are unremarkable. IMPRESSION: Negative. Electronically Signed   By: Sarah Raider M.D.   On: 02/18/2022 21:38    Procedures Procedures    Medications Ordered in ED  Medications - No data to display  ED Course/ Medical Decision Making/ A&P                           Medical Decision Making Risk Prescription drug management.   41 year old female here with right ankle pain.  Ongoing, worse tonight described as "pop".  Does note a twisting mechanism.  Minimal swelling to the lateral right ankle.  No significant pain with motion, no gross deformity.  No erythema or induration.  No warmth to touch.  Foot is intact.  X-ray was negative. Not consistent with septic joint. Will place in ASO, start antiinflammatories.  Recommend proceed with follow-up as scheduled next week, also given information for on call orthopedics if needed.  Can return here for new concerns.  Final Clinical Impression(s) / ED Diagnoses Final  diagnoses:  Acute right ankle pain    Rx / DC Orders ED Discharge Orders          Ordered    naproxen (NAPROSYN) 500 MG tablet  2 times daily        02/19/22 0008              Garlon Hatchet, PA-C 02/19/22 0013    Mesner, Barbara Cower, MD 02/19/22 0047    Garlon Hatchet, PA-C 02/19/22 0107    Marily Memos, MD 02/19/22 0430

## 2022-02-18 NOTE — ED Provider Triage Note (Signed)
Emergency Medicine Provider Triage Evaluation Note  Sarah Barr , a 41 y.o. female  was evaluated in triage.  Pt complains of right ankle pain x 2 weeks. Had made appointment with foot doctor, this upcoming Tuesday. Had injury at church tonight and heard a "pop" with numbness and increased pain in the foot.   Review of Systems  Positive: Foot/ankle pain Negative:   Physical Exam  BP 127/83 (BP Location: Right Arm)   Pulse (!) 107   Temp 98.6 F (37 C) (Oral)   Resp 20   LMP 01/28/2022   SpO2 95%  Gen:   Awake, no distress   Resp:  Normal effort  MSK:   Moves extremities without difficulty  Other:    Medical Decision Making  Medically screening exam initiated at 9:11 PM.  Appropriate orders placed.  Sarah Barr was informed that the remainder of the evaluation will be completed by another provider, this initial triage assessment does not replace that evaluation, and the importance of remaining in the ED until their evaluation is complete.     Su Monks, Cordelia Poche 02/18/22 2112

## 2022-02-18 NOTE — ED Triage Notes (Signed)
Patient reports pain at right heel for 2 weeks worse this evening " felt a pop" , denies injury or fall .

## 2022-02-19 DIAGNOSIS — M25571 Pain in right ankle and joints of right foot: Secondary | ICD-10-CM | POA: Diagnosis not present

## 2022-02-19 MED ORDER — NAPROXEN 250 MG PO TABS
500.0000 mg | ORAL_TABLET | Freq: Once | ORAL | Status: AC
  Administered 2022-02-19: 500 mg via ORAL
  Filled 2022-02-19: qty 2

## 2022-02-19 MED ORDER — NAPROXEN 500 MG PO TABS: 500.0000 mg | ORAL_TABLET | Freq: Two times a day (BID) | ORAL | 0 refills | Status: AC

## 2022-02-19 NOTE — Discharge Instructions (Signed)
Take the prescribed medication as directed.  Can ice and elevate if swelling persists. Follow-up with specialist next week as scheduled.  I have also listed information for our orthopedist on call if needed. Return to the ED for new or worsening symptoms.

## 2022-02-19 NOTE — Progress Notes (Signed)
Orthopedic Tech Progress Note Patient Details:  Sarah Barr 10/12/1980 [id number]  Ortho Devices Type of Ortho Device: ASO Ortho Device/Splint Location: rle Ortho Device/Splint Interventions: Ordered, Application, Adjustment   Post Interventions Patient Tolerated: Well  Al Decant 02/19/2022, 12:34 AM

## 2022-02-23 ENCOUNTER — Ambulatory Visit: Payer: Medicaid Other | Admitting: Podiatry

## 2022-02-26 ENCOUNTER — Ambulatory Visit: Payer: Commercial Managed Care - HMO | Admitting: Pulmonary Disease

## 2022-03-12 ENCOUNTER — Ambulatory Visit: Payer: Commercial Managed Care - HMO | Admitting: Pulmonary Disease

## 2022-04-20 ENCOUNTER — Other Ambulatory Visit: Payer: Self-pay | Admitting: *Deleted

## 2022-04-20 ENCOUNTER — Ambulatory Visit
Admission: RE | Admit: 2022-04-20 | Discharge: 2022-04-20 | Disposition: A | Discharge status: Home / Self Care | Payer: Self-pay | Source: Ambulatory Visit | Attending: Pulmonary Disease | Admitting: Pulmonary Disease

## 2022-04-20 DIAGNOSIS — R0609 Other forms of dyspnea: Secondary | ICD-10-CM

## 2022-04-20 DIAGNOSIS — R911 Solitary pulmonary nodule: Secondary | ICD-10-CM
